# Patient Record
Sex: Male | Born: 1935 | Race: White | Hispanic: No | Smoking: Former smoker
Health system: Southern US, Community
[De-identification: ages and names within clinical notes are randomized; demographics above are authoritative.]

## PROBLEM LIST (undated history)

## (undated) DIAGNOSIS — M1990 Unspecified osteoarthritis, unspecified site: Secondary | ICD-10-CM

## (undated) DIAGNOSIS — G4730 Sleep apnea, unspecified: Secondary | ICD-10-CM

## (undated) DIAGNOSIS — E119 Type 2 diabetes mellitus without complications: Secondary | ICD-10-CM

## (undated) DIAGNOSIS — I10 Essential (primary) hypertension: Secondary | ICD-10-CM

---

## 1979-08-27 HISTORY — PX: HERNIA REPAIR: SHX51

## 1979-08-27 HISTORY — PX: ANAL FISSURE REPAIR: SHX2312

## 1995-12-27 HISTORY — PX: ANTERIOR FUSION CERVICAL SPINE: SUR626

## 2001-12-26 HISTORY — PX: JOINT REPLACEMENT: SHX530

## 2004-12-16 ENCOUNTER — Ambulatory Visit: Payer: Self-pay | Admitting: Family Medicine

## 2005-03-23 ENCOUNTER — Ambulatory Visit: Payer: Self-pay | Admitting: Family Medicine

## 2005-08-31 ENCOUNTER — Ambulatory Visit: Payer: Self-pay | Admitting: Family Medicine

## 2005-09-13 ENCOUNTER — Ambulatory Visit: Payer: Self-pay | Admitting: Family Medicine

## 2005-10-18 ENCOUNTER — Ambulatory Visit: Payer: Self-pay | Admitting: Family Medicine

## 2005-12-30 ENCOUNTER — Ambulatory Visit: Payer: Self-pay | Admitting: Family Medicine

## 2006-02-17 ENCOUNTER — Ambulatory Visit: Payer: Self-pay | Admitting: Family Medicine

## 2007-12-27 HISTORY — PX: BACK SURGERY: SHX140

## 2008-09-03 ENCOUNTER — Inpatient Hospital Stay (HOSPITAL_COMMUNITY): Admission: RE | Admit: 2008-09-03 | Discharge: 2008-09-11 | Payer: Self-pay | Admitting: Neurosurgery

## 2009-11-17 IMAGING — RF DG LUMBAR SPINE 2-3V
1 series · 2 of 2 positions shown · non-contrast
Comparison: Intraoperative radiographs dated 09/03/2008

CLINICAL DATA: Lumbar spinal stenosis.  Spondylolisthesis.

LUMBAR SPINE - 2-3 VIEW

[Series 1: run · 2 of 2 slices shown]
[im 1/2]
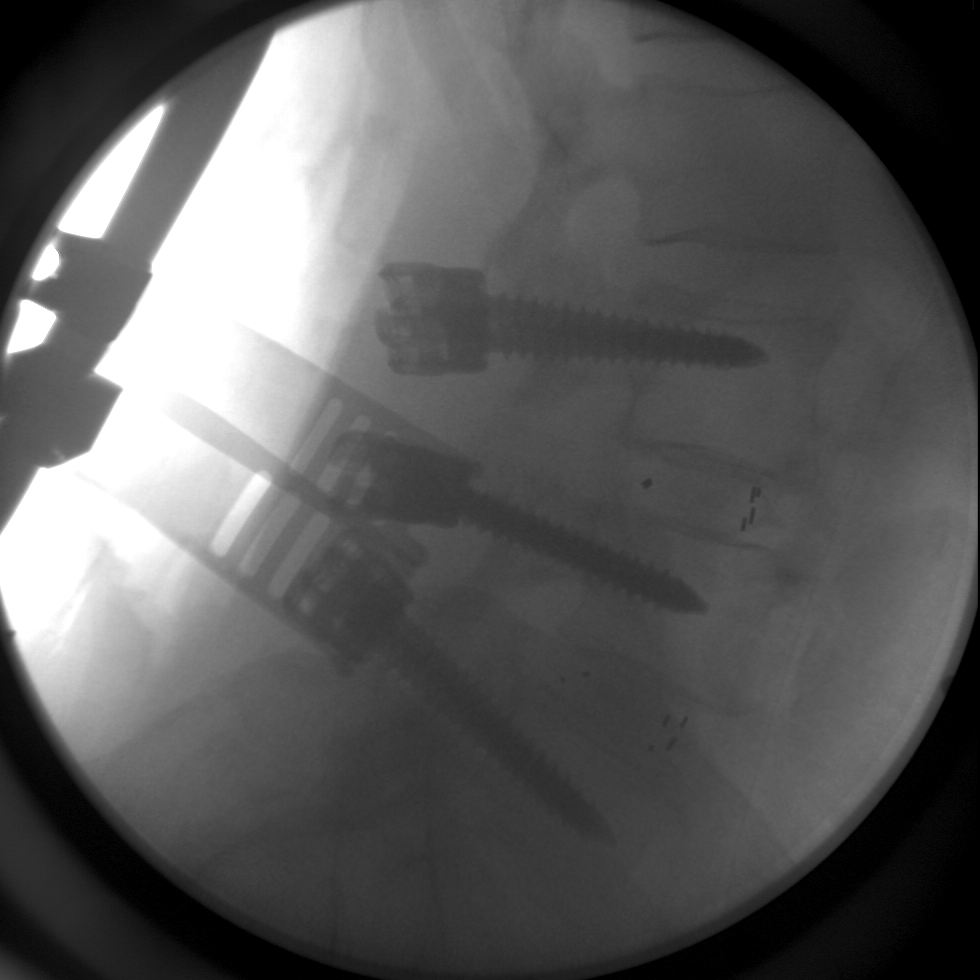
[im 2/2]
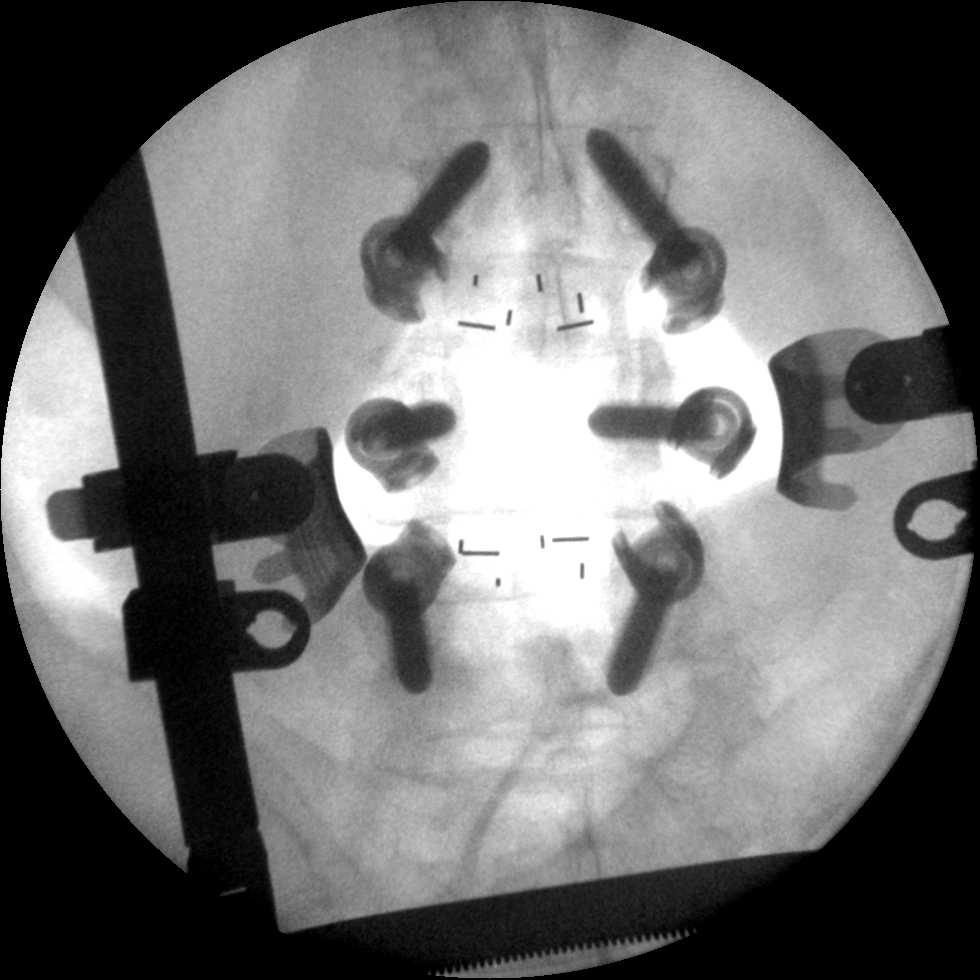

[2 of 2 positions shown; findings below may reference images not displayed]

FINDINGS: Interpedicular screws are in place at L3, L4, and L5.
Interbody spacers are present at L3-4 and L4-5.  Alignment is
anatomic.
IMPRESSION: Fusions being performed at L3-4 and L4-5.

## 2009-11-17 IMAGING — CR DG CHEST 2V
2 series · 2 of 2 positions shown · non-contrast
Comparison: None

CLINICAL DATA: Spondylolisthesis.

CHEST - 2 VIEW

[view not recorded (1 of 2)]
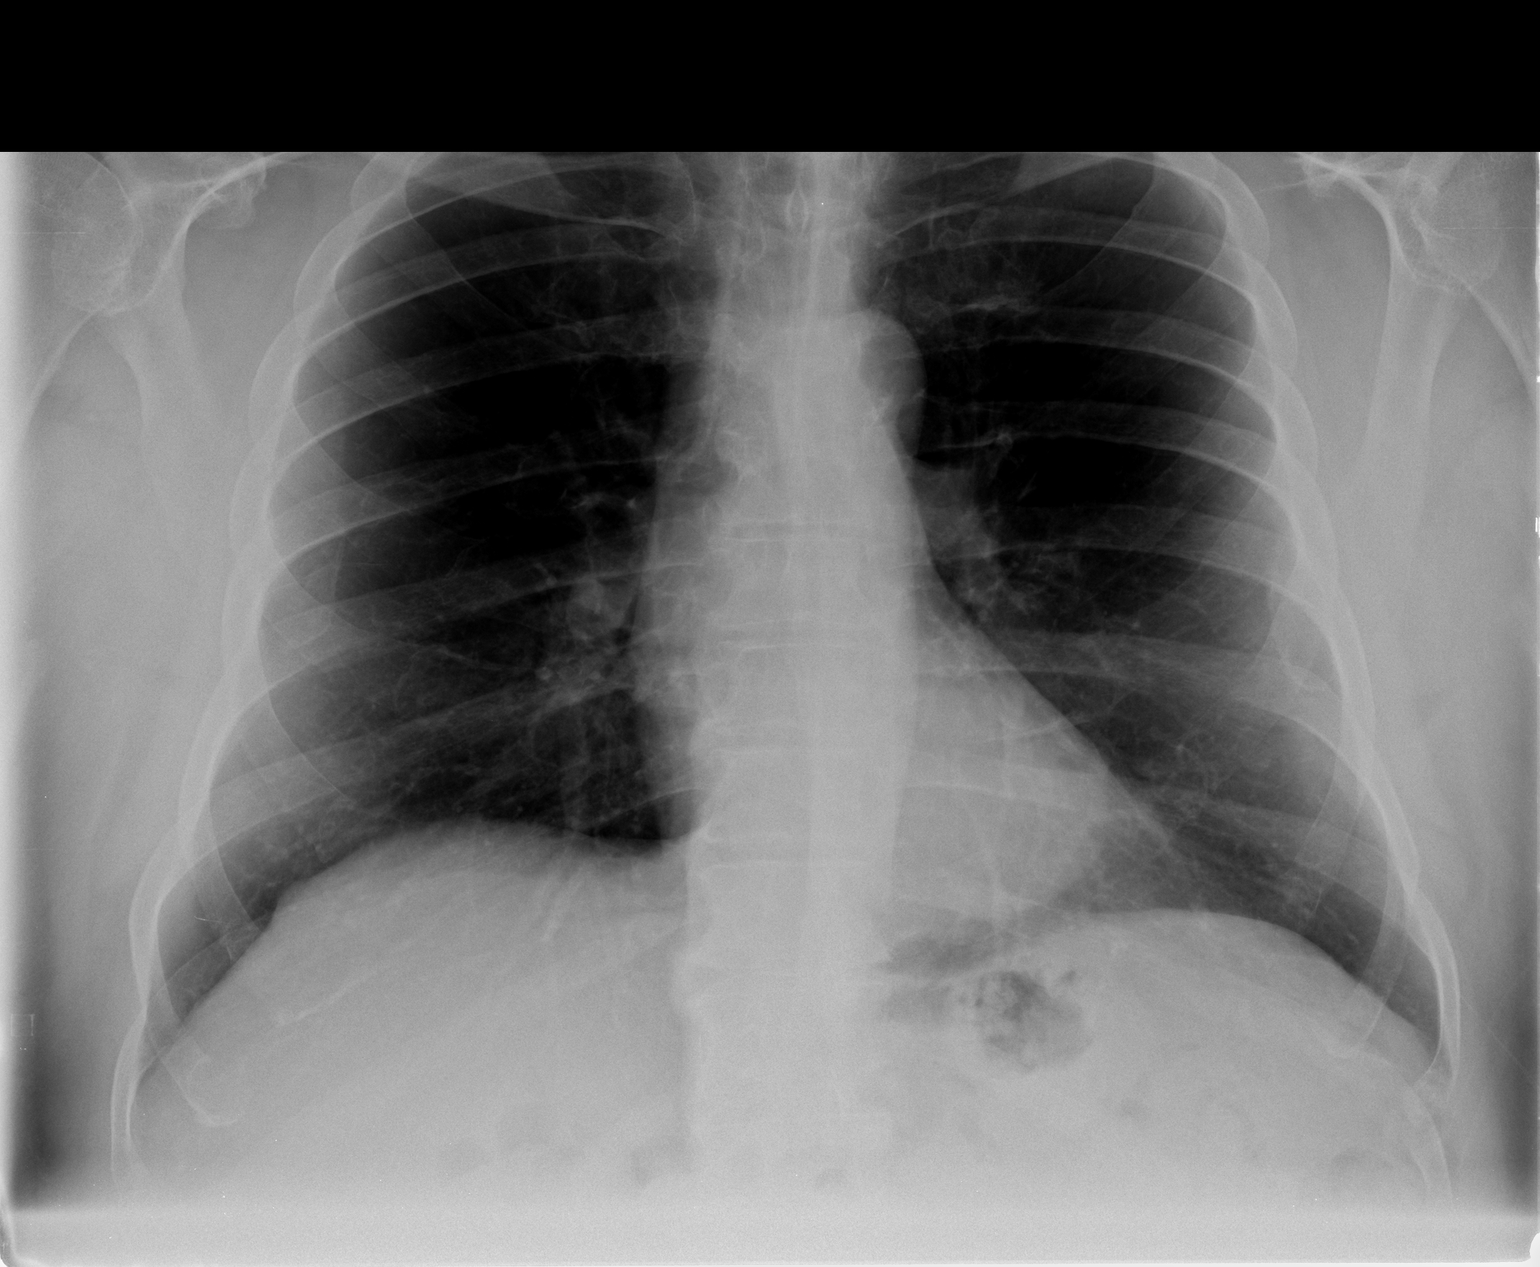

[view not recorded (2 of 2)]
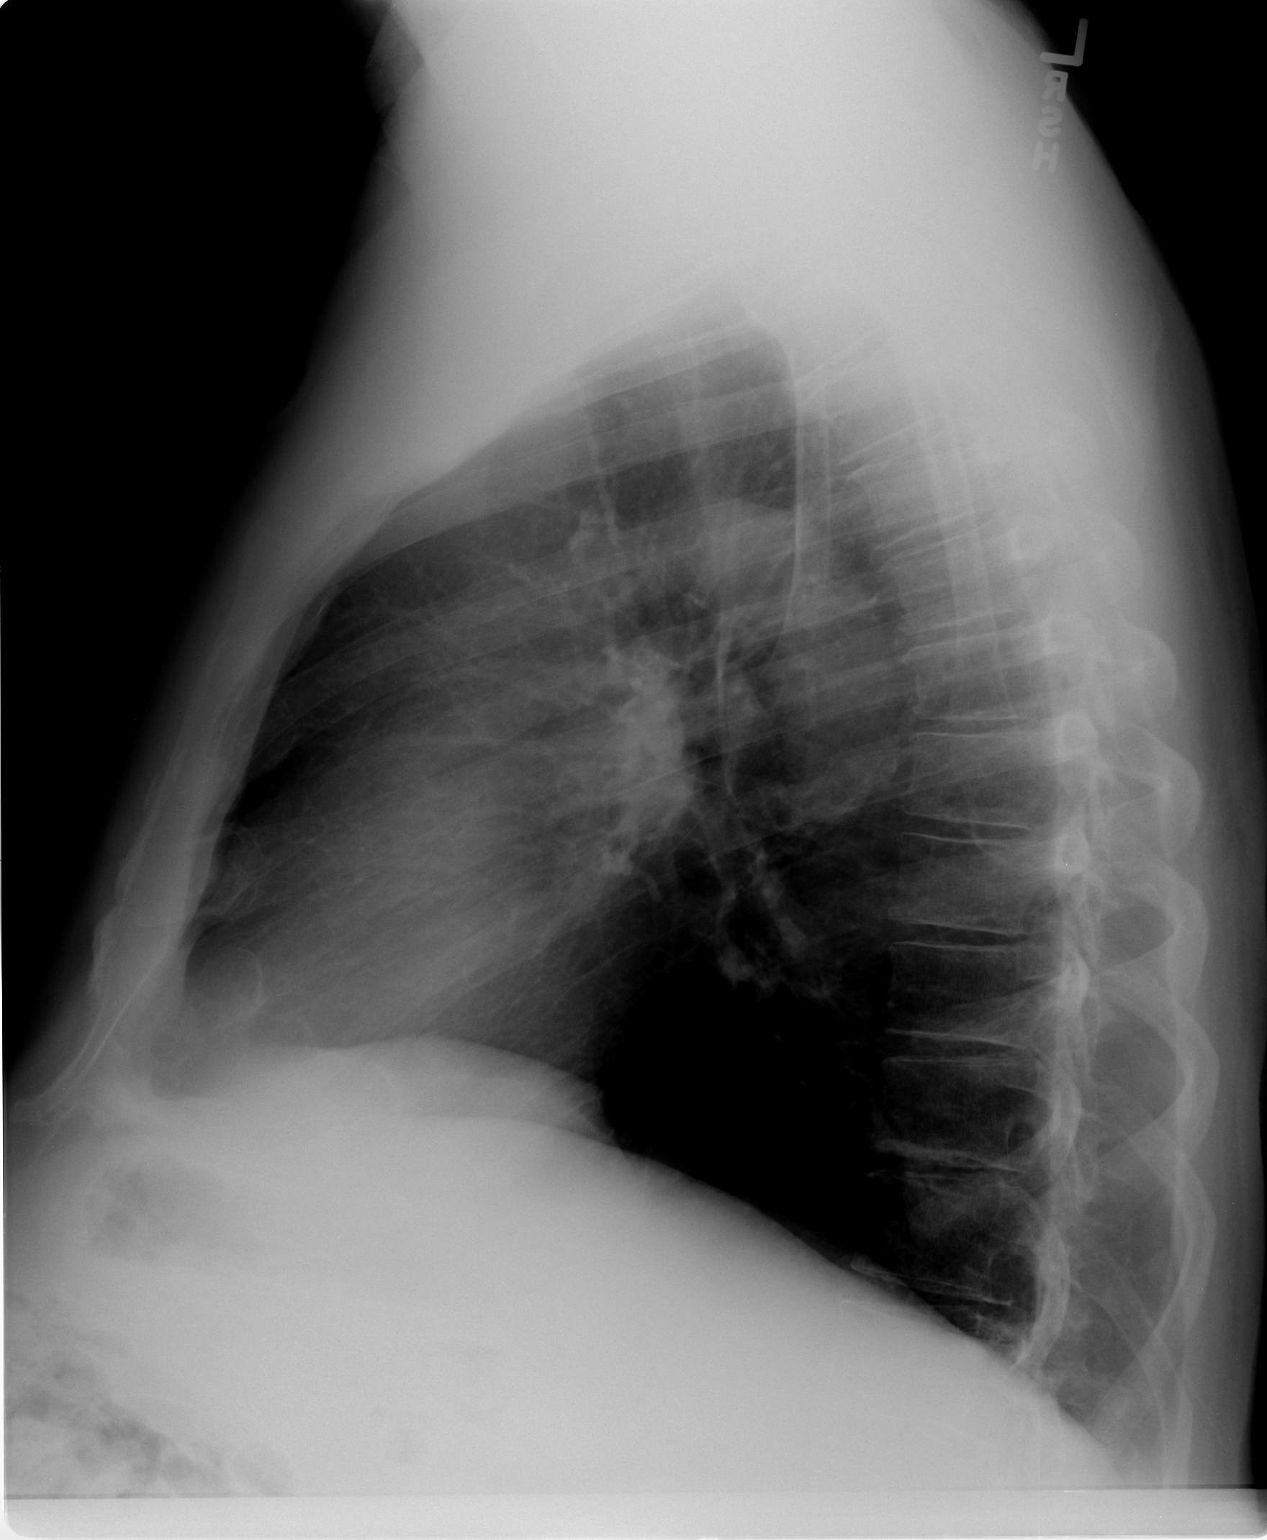

[2 of 2 positions shown; findings below may reference images not displayed]

FINDINGS: Heart size and mediastinal contours are unremarkable.

No pleural effusion or pulmonary interstitial edema is noted.

There is no airspace densities identified.
IMPRESSION: 1.  No acute cardiopulmonary abnormalities.

## 2011-05-10 NOTE — Discharge Summary (Signed)
Joshua Rich, JUTRAS NO.:  [id number]   MEDICAL RECORD NO.:  [id number]          PATIENT TYPE:  INP   LOCATION:  3016                         FACILITY:  [facility]   PHYSICIAN:  Cristi Loron, M.D.DATE OF BIRTH:  18-Nov-1935   DATE OF ADMISSION:  09/03/2008  DATE OF DISCHARGE:  09/11/2008                               DISCHARGE SUMMARY   BRIEF HISTORY:  The patient is a 76 year old white male who suffered  from chronic back and leg pain that has become severe over the last  several months.  She failed medical management and was worked up with a  lumbar MRI which demonstrated he had lumbar scoliosis,  spondylolisthesis, stenosis, etc., at L3-4 and L4-5.  I discussed the  various treatment with the patient including surgery.  The patient has  weighed the risks, benefits and alternatives of surgery and has decided  to proceed with an L3-4 and 4-5 decompression, instrumentation and  fusion.   For further details of this admission, please refer to typed history and  physical.   HOSPITAL COURSE:  I admitted the patient to [facility] on  September 03, 2008.  On the day of admission, I performed L3-4 and 4-5  decompression, instrumentation and fusion.  The surgery went well (for  full details of this operation, please refer to typed operative note).   POSTOPERATIVE COURSE:  The patient's postoperative course was  unremarkable.  We had PT and OT see the patient.  The patient did not  feel he was able to care for himself at home (he lives alone), and  therefore, arrangements were made for him to go to a nursing home for  recuperation from the surgery.  The arrangements were made.  The patient  was transferred to [facility] on September 11, 2008.   FINAL DIAGNOSES:  L3-4 and L4-5 spondylolisthesis, scoliosis, spinal  stenosis, degenerative disk disease, lumbar radiculopathy, lumbar  myelopathy, lumbago.   PROCEDURE PERFORMED:  L4 decompressive laminectomy  with bilateral L3  decompressive laminotomies to decompress the bilateral L3, L4 and L5  nerve roots; L3-4 and 4-5 posterior lumbar interbody fusion with local  morselized autograft bone and Vitoss/Actifuse bone graft extender;  insertion of L3-4 and L4-5 interbody prosthesis (PEEK interbody  prosthesis); L3-L5 posterior segmental instrumentation with Legacy  titanium pedicle screws and rods; L3-4 and 4-5 posterolateral  arthrodesis with local morselized autograft bone, Vitoss and Actifuse  bone graft extenders.   DISCHARGE INSTRUCTIONS:  The patient was given written discharge  instructions, and also questions were answered, and he is to follow up  with me in about 3 weeks.   DISCHARGE MEDICATIONS:  1. Colace 100 mg p.o. b.i.d.  2. Coreg 12.5 mg p.o. b.i.d.  3. Lasix 60 mg p.o. daily.  4. Lisinopril 40 mg p.o. daily.  5. Amaryl 2 mg p.o. daily.  6. Zocor 20 mg p.o. daily.  7. Regular insulin sliding scale.  8. Milk of magnesia 30 mg daily.  9. Dulcolax suppository p.r.n.  10.Percocet p.r.n.  11.Tylenol p.r.n.  12.Valium p.r.n.  13.Zofran p.r.n.  14.Ambien p.r.n.  15.Senokot p.r.n.  16.Cepacol p.r.n.   FINAL DIAGNOSES:  1. Hypertension.  2. Hypercholesterolemia.  3. Diabetes mellitus.  4. Cervical disk disease.  5. Knee osteoarthritis.      Cristi Loron, M.D.  Electronically Signed     JDJ/MEDQ  D:  09/11/2008  T:  09/11/2008  Job:  [id number]

## 2011-05-10 NOTE — Op Note (Signed)
NAMEGRAYDON, Joshua Rich NO.:  [id number]   MEDICAL RECORD NO.:  [id number]          PATIENT TYPE:  INP   LOCATION:  3016                         FACILITY:  [facility]   PHYSICIAN:  Cristi Loron, M.D.DATE OF BIRTH:  10-10-1935   DATE OF PROCEDURE:  09/03/2008  DATE OF DISCHARGE:                               OPERATIVE REPORT   BRIEF HISTORY:  The patient is a 76 year old white male who suffered  from chronic back and leg pain that has become severe over the last  several months.  He failed medical management and was worked up with a  lumbar MRI, which demonstrated he had lumbar scoliosis,  spondylolisthesis, stenosis, degenerative disk disease, etc., at L3-4  and L4-5.  I discussed various treatment options with the patient  including surgery.  The patient has weighed the risks, benefits, and  alternative for surgery and has decided to proceed with a L3-4 and L4-5  decompression, fusion, and instrumentation.   PREOPERATIVE STENOSES:  L3-4 and L4-5 spondylolisthesis, scoliosis,  spinal stenosis, degenerative disk disease, lumbar radiculopathy status  myelopathy, and lumbago.   POSTOPERATIVE DIAGNOSES:  L3-4 and L4-5 spondylolisthesis, scoliosis,  spinal stenosis, degenerative disk disease, lumbar radiculopathy status  myelopathy, and lumbago.   PROCEDURE:  1. L4 decompressive laminectomy with bilateral L3 laminotomies.  2. Decompressive bilateral L3, L4, and L5 nerve roots; L3-4 and L4-5      posterior lumbar interbody fusion with local morselized autograft      bone, Vitoss, and Actifuse bone graft extenders; insertion of L3-4      and L4-5 interbody prosthesis (Capstone PEEK interbody prosthesis);      L3-L5 posterior segmental instrumentation with Legacy titanium      pedicle screws and rods; L3-4 and L4-5 posterolateral arthrodesis      with local morselized autograft bone, Vitoss, and Actifuse bone      graft extenders.   SURGEON:  Cristi Loron,  MD.   ASSISTANT:  Stefani Dama, MD.   ANESTHESIA:  General endotracheal.   ESTIMATED BLOOD LOSS:  350 mL.   SPECIMENS:  None.   DRAINS:  None.   COMPLICATIONS:  Durotomy.   DESCRIPTION OF PROCEDURE:  The patient was brought to the operating room  by the Anesthesia Team. General endotracheal anesthesia was induced.  The patient was then turned to the prone position on Wilson frame.  His  lumbosacral region was then prepared with Betadine scrub and Betadine  solution.  Sterile drapes were applied.  I then injected the area to be  incised with Marcaine with epinephrine solution.  I used a scalpel to  make a linear midline incision over the L3-4 and L4-5 interspaces.  I  used electrocautery to perform a bilateral subperiosteal dissection  exposing spinous process and lamina of L2, L3, L4, L5, upper sacrum.  We  obtained intraoperative radiograph to confirm our location.  We inserted  the Versa-Trac retractor for exposure.   We began the decompression by performing bilateral L3-L4 laminotomies  with a high-speed drill.  I incised the interspinous ligament at L4-5  and L3-4.  I then used a Leksell rongeur to remove the spinous process  and part of the L4 lamina.  We saved this bone and later cleared off  soft tissue, morselized, and used as local autograft bone.  I completed  the L4 laminectomy and widened the L3 laminotomies with Kerrison punch  and removed the ligamentum flavum at L3-4 and L4-5.  Of note, the  patient has severe facet arthropathy at both the L3-4 and L4-5 and  resultant severe spinal stenosis, and therefore the work required to do  decompression at this level was much greater than would have simply been  required to do it with.  We removed the excess ligamentum flavum from  lateral recess and removed the medial aspect of the L3-4 and L4-5 facet  joints.  We then performed foraminotomies about the bilateral L3, L4,  and L5 nerve roots completing the  decompression.   We now turned our attention to posterior lumbar interbody fusion.  We  incised the L3-4 and L4-5 intervertebral disks with a 15 blade scalpel  using pituitary forceps and to perform a partial intervertebral  diskectomy bilaterally at L3-4 and L4-5.  We prepared the vertebral  endplates by using curettes for removal of the soft tissue.  We then  used trial spacers and determined to use 12 x 26 mm Capstone PEEK  interbody prosthesis.  We prefilled this prosthesis with combination of  local autograft bone, Vitoss, and Actifuse, and then as we did the disk  space as well, we then inserted a prosthesis into the disk space, of  course after retracting the neural structures out of harm's way, I then  had a snug fit of the prosthesis bilaterally at L3-4 and L4-5, and as  well, we did fill in the remainder disk space with local autograft bone,  Actifuse, and Vitoss completing the posterior lumbar interbody fusion.   We now turned our attention to the instrumentation.  Under fluoroscopic  guidance, we cannulated bilateral L3, L4, and L5 pedicles with a bone  probe.  We tapped the pedicles with a 5.5 mm tap.  We probed inside the  tap pedicles to right cortical breaches with a ball probe.  We inserted  7.5 x 50 mm pedicle screws bilaterally at L3, L4, and L5 under  fluoroscopic guidance.  We palpated along the medial aspect of the each  pedicle and noticed no cortical breaches and nerve roots were not  injured.  We then connected unilateral pedicle screws with a lordotic  rod.  We compressed to construct and secured the rod in place with the  caps, which we tightened appropriately.  We then placed a cross  connector and tightened appropriately completing the instrumentation.   We now turned our attention to posterolateral arthrodesis.  We used a  high-speed drill to decorticate the remainder of L3-4 and L4-5 facets,  pars, and transverse processes bilaterally.  We then laid a  combination  of local autograft bone, Vitoss, and Actifuse bone graft extenders over  these decorticated posterolateral structures completing the  posterolateral arthrodesis.   We then inspected bilateral L3, L4, and L5 nerve roots, and the thecal  sac and noted it was well decompressed.   I should mention that during the decompression, a small durotomy was  created with the Kerrison punch.  I closed the durotomy with a single 6-  0 Prolene suture.  I got a good closure, and then placed Tisseel over  this durotomy site.   We then obtained  hemostasis using bipolar electrocautery.  We irrigated  the wound out bacitracin solution and removed the retractors and then  reapproximated the patient's thoracolumbar fascia with interrupted #1  Vicryl suture, subcutaneous tissue with interrupted 2-0 Vicryl suture,  and the skin with Steri-Strips and Benzoin.  The wound was then coated  with bacitracin ointment.  Sterile dressing was applied.  The drapes  were removed, and the patient was  subsequently returned to supine position and was extubated by the  Anesthesia Team and transported to post anesthesia care unit in stable  condition.  All sponge, instrument, and needle counts were correct at  the end of this case.      Cristi Loron, M.D.  Electronically Signed     JDJ/MEDQ  D:  09/03/2008  T:  09/04/2008  Job:  [id number]

## 2011-09-26 LAB — GLUCOSE, CAPILLARY
Glucose-Capillary: 111 — ABNORMAL HIGH
Glucose-Capillary: 123 — ABNORMAL HIGH
Glucose-Capillary: 131 — ABNORMAL HIGH
Glucose-Capillary: 133 — ABNORMAL HIGH
Glucose-Capillary: 140 — ABNORMAL HIGH
Glucose-Capillary: 144 — ABNORMAL HIGH

## 2011-09-28 LAB — BASIC METABOLIC PANEL
Calcium: 9.6
Chloride: 109
Creatinine, Ser: 1.94 — ABNORMAL HIGH
GFR calc Af Amer: 41 — ABNORMAL LOW
GFR calc non Af Amer: 34 — ABNORMAL LOW
GFR calc non Af Amer: 56 — ABNORMAL LOW
Glucose, Bld: 108 — ABNORMAL HIGH
Potassium: 3.8
Sodium: 140
Sodium: 142

## 2011-09-28 LAB — GLUCOSE, CAPILLARY
Glucose-Capillary: 117 — ABNORMAL HIGH
Glucose-Capillary: 121 — ABNORMAL HIGH
Glucose-Capillary: 122 — ABNORMAL HIGH
Glucose-Capillary: 124 — ABNORMAL HIGH
Glucose-Capillary: 130 — ABNORMAL HIGH
Glucose-Capillary: 131 — ABNORMAL HIGH
Glucose-Capillary: 146 — ABNORMAL HIGH
Glucose-Capillary: 150 — ABNORMAL HIGH
Glucose-Capillary: 159 — ABNORMAL HIGH
Glucose-Capillary: 160 — ABNORMAL HIGH
Glucose-Capillary: 170 — ABNORMAL HIGH
Glucose-Capillary: 173 — ABNORMAL HIGH
Glucose-Capillary: 199 — ABNORMAL HIGH
Glucose-Capillary: 205 — ABNORMAL HIGH
Glucose-Capillary: 63 — ABNORMAL LOW

## 2011-09-28 LAB — CBC
HCT: 34 — ABNORMAL LOW
Hemoglobin: 11.4 — ABNORMAL LOW
Hemoglobin: 14.3
MCHC: 33.5
MCV: 91.3
RBC: 4.65
RDW: 13.9
WBC: 5.9

## 2011-09-28 LAB — ABO/RH: ABO/RH(D): A POS

## 2011-09-28 LAB — TYPE AND SCREEN
ABO/RH(D): A POS
Antibody Screen: NEGATIVE

## 2012-06-20 DIAGNOSIS — G959 Disease of spinal cord, unspecified: Secondary | ICD-10-CM | POA: Insufficient documentation

## 2012-06-20 DIAGNOSIS — M48061 Spinal stenosis, lumbar region without neurogenic claudication: Secondary | ICD-10-CM | POA: Insufficient documentation

## 2012-07-31 DIAGNOSIS — E669 Obesity, unspecified: Secondary | ICD-10-CM | POA: Insufficient documentation

## 2012-07-31 DIAGNOSIS — G4733 Obstructive sleep apnea (adult) (pediatric): Secondary | ICD-10-CM | POA: Insufficient documentation

## 2012-07-31 DIAGNOSIS — R202 Paresthesia of skin: Secondary | ICD-10-CM | POA: Insufficient documentation

## 2012-07-31 DIAGNOSIS — E119 Type 2 diabetes mellitus without complications: Secondary | ICD-10-CM | POA: Insufficient documentation

## 2012-07-31 DIAGNOSIS — G4730 Sleep apnea, unspecified: Secondary | ICD-10-CM | POA: Insufficient documentation

## 2012-07-31 DIAGNOSIS — I10 Essential (primary) hypertension: Secondary | ICD-10-CM | POA: Insufficient documentation

## 2012-07-31 DIAGNOSIS — M549 Dorsalgia, unspecified: Secondary | ICD-10-CM | POA: Insufficient documentation

## 2012-07-31 DIAGNOSIS — M542 Cervicalgia: Secondary | ICD-10-CM | POA: Insufficient documentation

## 2012-08-09 DIAGNOSIS — M25569 Pain in unspecified knee: Secondary | ICD-10-CM | POA: Insufficient documentation

## 2012-08-09 DIAGNOSIS — R32 Unspecified urinary incontinence: Secondary | ICD-10-CM | POA: Insufficient documentation

## 2012-08-09 DIAGNOSIS — Z973 Presence of spectacles and contact lenses: Secondary | ICD-10-CM | POA: Insufficient documentation

## 2012-08-09 DIAGNOSIS — E785 Hyperlipidemia, unspecified: Secondary | ICD-10-CM | POA: Insufficient documentation

## 2012-08-09 DIAGNOSIS — F32A Depression, unspecified: Secondary | ICD-10-CM | POA: Insufficient documentation

## 2012-08-10 HISTORY — PX: POSTERIOR CERVICAL FUSION/FORAMINOTOMY: SHX5038

## 2012-08-15 HISTORY — PX: THORACIC SPINE SURGERY: SHX802

## 2012-10-01 DIAGNOSIS — G709 Myoneural disorder, unspecified: Secondary | ICD-10-CM | POA: Insufficient documentation

## 2014-02-23 ENCOUNTER — Other Ambulatory Visit: Payer: Self-pay | Admitting: Surgical

## 2014-02-23 NOTE — H&P (Signed)
TOTAL KNEE ADMISSION H&P  Patient is being admitted for right total knee arthroplasty.  Subjective:  Chief Complaint:right knee pain.  HPI: Joshua Rich, 79 y.o. male, has a history of pain and functional disability in the right knee due to arthritis and has failed non-surgical conservative treatments for greater than 12 weeks to includeNSAID's and/or analgesics, corticosteriod injections, viscosupplementation injections and activity modification.  Onset of symptoms was gradual, starting >10 years ago with gradually worsening course since that time. The patient noted no past surgery on the right knee(s).  Patient currently rates pain in the right knee(s) at 8 out of 10 with activity. Patient has night pain, worsening of pain with activity and weight bearing, pain that interferes with activities of daily living, pain with passive range of motion, crepitus and joint swelling.  Patient has evidence of periarticular osteophytes and joint space narrowing by imaging studies. There is no active infection.   Allergies No Known Drug Allergies.  Family History Cancer. Father. Congestive Heart Failure. Brother. Depression. Sister. Diabetes Mellitus. Brother, Father, Sister. Drug / Alcohol Addiction. Father. Hypertension. Brother, Father, Sister.  Social History Children. 2 Current drinker.Currently drinks beer less than 5 times per week Current work status. retired Exercise. Exercises daily Living situation. live alone Marital status. widowed Tobacco use. Former smoker. smoke(d) 1/2 pack(s) per day Under pain contract  Medication History Cartia XT (180MG  Capsule ER 24HR, Oral) Active. Irbesartan (300MG  Tablet, Oral) Active. Furosemide (40MG  Tablet, Oral) Active. Sertraline HCl (25MG  Tablet, Oral) Active. Glimepiride (1MG  Tablet, Oral) Active. Carvedilol (25MG  Tablet, Oral) Active. Pravastatin Sodium (20MG  Tablet, Oral) Active.  Past Surgical History Anal Fissure  Repair Colon Polyp Removal - Colonoscopy Inguinal Hernia Repair. laparoscopic: bilateral and multiple times Neck Disc Surgery Spinal Fusion. neck and lower back Spinal Surgery Total Knee Replacement. left  Past Medical History Chronic Pain Depression Diabetes Mellitus, Type II Hypertension Hypercholesterolemia Osteoarthritis Osteoporosis Peripheral Neuropathy Sleep Apnea  Review of Systems  Constitutional: Negative for fever, chills, weight loss, malaise/fatigue and diaphoresis.  HENT: Positive for hearing loss and tinnitus. Negative for congestion, ear discharge, ear pain, nosebleeds and sore throat.   Eyes: Negative.   Respiratory: Negative.  Negative for stridor.   Cardiovascular: Negative.   Gastrointestinal: Negative.   Genitourinary: Positive for frequency. Negative for dysuria, urgency, hematuria and flank pain.  Musculoskeletal: Positive for back pain and joint pain. Negative for falls, myalgias and neck pain.       Right knee pain  Skin: Positive for itching and rash.       Due to eczema  Neurological: Positive for weakness. Negative for dizziness, tingling, tremors, sensory change, speech change, focal weakness, seizures, loss of consciousness and headaches.  Endo/Heme/Allergies: Negative.   Psychiatric/Behavioral: Negative.     Objective:  Physical Exam  Constitutional: He is oriented to person, place, and time. He appears well-developed and well-nourished. No distress.  HENT:  Head: Normocephalic and atraumatic.  Right Ear: External ear normal.  Left Ear: External ear normal.  Nose: Nose normal.  Mouth/Throat: Oropharynx is clear and moist.  Eyes: Conjunctivae are normal.  Neck: Normal range of motion. Neck supple.  Cardiovascular: Normal rate, regular rhythm, normal heart sounds and intact distal pulses.   No murmur heard. Respiratory: Effort normal and breath sounds normal. No respiratory distress. He has no wheezes.  GI: Soft. He exhibits no  distension. There is no tenderness.  Musculoskeletal:       Right hip: Normal.       Left hip: Normal.  Right knee: He exhibits decreased range of motion, swelling and effusion. He exhibits no erythema. Tenderness found. Medial joint line and lateral joint line tenderness noted.       Left knee: Normal.       Right lower leg: He exhibits edema. He exhibits no tenderness.       Left lower leg: Normal.  His hip shows normal range of motion and no discomfort. His left knee look great. There is no swelling. Range is 0 to 120 degrees with no tenderness or instability. The right knee varus deformity. Small effusion. Range about 5 to 120. Marked crepitus on range of motion. Tender medial greater than lateral with no instability noted  Neurological: He is alert and oriented to person, place, and time. He has normal strength and normal reflexes. No sensory deficit.  Skin: No rash noted. He is not diaphoretic. No erythema.  Psychiatric: He has a normal mood and affect. His behavior is normal.   Vitals Weight: 211 lb Height: 68 in Body Surface Area: 2.14 m Body Mass Index: 32.08 kg/m Pulse: 72 (Regular) BP: 142/88 (Sitting, Left Arm, Standard)  Imaging Review Plain radiographs demonstrate severe degenerative joint disease of the right knee(s). The overall alignment ismild varus. The bone quality appears to be adequate for age and reported activity level.  Assessment/Plan:  End stage arthritis, right knee   The patient history, physical examination, clinical judgment of the provider and imaging studies are consistent with end stage degenerative joint disease of the right knee(s) and total knee arthroplasty is deemed medically necessary. The treatment options including medical management, injection therapy arthroscopy and arthroplasty were discussed at length. The risks and benefits of total knee arthroplasty were presented and reviewed. The risks due to aseptic loosening,  infection, stiffness, patella tracking problems, thromboembolic complications and other imponderables were discussed. The patient acknowledged the explanation, agreed to proceed with the plan and consent was signed. Patient is being admitted for inpatient treatment for surgery, pain control, PT, OT, prophylactic antibiotics, VTE prophylaxis, progressive ambulation and ADL's and discharge planning. The patient is planning to be discharged to skilled nursing facility (prefers [facility] in [place])  PCP: Dr. Dina Rich Other: Dr. Orene Desanctis   Patient to receive TXA   Dimitri Ped, PA-C

## 2014-03-03 NOTE — Progress Notes (Signed)
Surgery on 03/17/14,.  proep on 03/11/14 at 1000am.  Need orders in EPIC.  Thank You.

## 2014-03-04 ENCOUNTER — Other Ambulatory Visit: Payer: Self-pay | Admitting: Orthopedic Surgery

## 2014-03-05 ENCOUNTER — Encounter (HOSPITAL_COMMUNITY): Payer: Self-pay | Admitting: Pharmacy Technician

## 2014-03-07 ENCOUNTER — Other Ambulatory Visit (HOSPITAL_COMMUNITY): Payer: Self-pay | Admitting: Orthopedic Surgery

## 2014-03-07 NOTE — Patient Instructions (Addendum)
20 Joshua Rich  03/07/2014   Your procedure is scheduled on: Monday February 23rd  Report to [facility] at 730 AM.  Call this number if you have problems the morning of surgery [phone]   Remember: bring your cpap mask and tubing, short stay will draw your blood type morning of surgery.  Do not eat food or drink liquids :After Midnight.     Take these medicines the morning of surgery with A SIP OF WATER: carvedilol, cardizem (cartia), irbesartan, sertraline                                SEE [facility] PREPARING FOR SURGERY SHEET             You may not have any metal on your body including hair pins and piercings  Do not wear jewelry, make-up.  Do not wear lotions, powders, or perfumes. No  Deodorant is to be worn.   Men may shave face and neck.  Do not bring valuables to the hospital. [facility] IS NOT RESPONSIBLE FOR VALUEABLES.  Contacts, dentures or bridgework may not be worn into surgery.  Leave suitcase in the car. After surgery it may be brought to your room.  For patients admitted to the hospital, checkout time is 11:00 AM the day of discharge.    Please read over the following fact sheets that you were given: [facility] Preparing for surgery sheet,  incentive spirometer sheet, blood fact sheet, MRSA information  Call Cain SieveSharon Swan Zayed RN pre op nurse if needed [phone]    FAILURE TO FOLLOW THESE INSTRUCTIONS MAY RESULT IN THE CANCELLATION OF YOUR SURGERY.  PATIENT SIGNATURE___________________________________________  NURSE SIGNATURE_____________________________________________

## 2014-03-11 ENCOUNTER — Encounter (HOSPITAL_COMMUNITY)
Admission: RE | Admit: 2014-03-11 | Discharge: 2014-03-11 | Disposition: A | Discharge status: Home / Self Care | Payer: Medicare Other | Source: Ambulatory Visit | Attending: Orthopedic Surgery | Admitting: Orthopedic Surgery

## 2014-03-11 ENCOUNTER — Ambulatory Visit (HOSPITAL_COMMUNITY)
Admission: RE | Admit: 2014-03-11 | Discharge: 2014-03-11 | Disposition: A | Discharge status: Home / Self Care | Payer: Medicare Other | Source: Ambulatory Visit | Attending: Orthopedic Surgery | Admitting: Orthopedic Surgery

## 2014-03-11 ENCOUNTER — Encounter (HOSPITAL_COMMUNITY): Payer: Self-pay

## 2014-03-11 ENCOUNTER — Encounter (INDEPENDENT_AMBULATORY_CARE_PROVIDER_SITE_OTHER): Payer: Self-pay

## 2014-03-11 DIAGNOSIS — Z01812 Encounter for preprocedural laboratory examination: Secondary | ICD-10-CM | POA: Insufficient documentation

## 2014-03-11 DIAGNOSIS — I440 Atrioventricular block, first degree: Secondary | ICD-10-CM | POA: Insufficient documentation

## 2014-03-11 DIAGNOSIS — Z01818 Encounter for other preprocedural examination: Secondary | ICD-10-CM | POA: Insufficient documentation

## 2014-03-11 DIAGNOSIS — M169 Osteoarthritis of hip, unspecified: Secondary | ICD-10-CM | POA: Insufficient documentation

## 2014-03-11 DIAGNOSIS — Z01810 Encounter for preprocedural cardiovascular examination: Secondary | ICD-10-CM | POA: Insufficient documentation

## 2014-03-11 DIAGNOSIS — M1610 Unilateral primary osteoarthritis, unspecified hip: Secondary | ICD-10-CM | POA: Insufficient documentation

## 2014-03-11 HISTORY — DX: Type 2 diabetes mellitus without complications: E11.9

## 2014-03-11 HISTORY — DX: Unspecified osteoarthritis, unspecified site: M19.90

## 2014-03-11 HISTORY — DX: Essential (primary) hypertension: I10

## 2014-03-11 HISTORY — DX: Sleep apnea, unspecified: G47.30

## 2014-03-11 LAB — COMPREHENSIVE METABOLIC PANEL
ALK PHOS: 94 U/L (ref 39–117)
ALT: 15 U/L (ref 0–53)
AST: 18 U/L (ref 0–37)
Albumin: 4.1 g/dL (ref 3.5–5.2)
BUN: 12 mg/dL (ref 6–23)
CALCIUM: 9.4 mg/dL (ref 8.4–10.5)
CO2: 30 meq/L (ref 19–32)
Chloride: 100 mEq/L (ref 96–112)
Creatinine, Ser: 1.06 mg/dL (ref 0.50–1.35)
GFR calc non Af Amer: 66 mL/min — ABNORMAL LOW (ref >90)
GFR, EST AFRICAN AMERICAN: 77 mL/min — AB (ref >90)
GLUCOSE: 142 mg/dL — AB (ref 70–99)
POTASSIUM: 3.4 meq/L — AB (ref 3.7–5.3)
Sodium: 141 mEq/L (ref 137–147)
Total Bilirubin: 0.5 mg/dL (ref 0.3–1.2)
Total Protein: 7.3 g/dL (ref 6.0–8.3)

## 2014-03-11 LAB — SURGICAL PCR SCREEN
MRSA, PCR: NEGATIVE
Staphylococcus aureus: POSITIVE — AB

## 2014-03-11 LAB — APTT: aPTT: 27 seconds (ref 24–37)

## 2014-03-11 LAB — URINALYSIS, ROUTINE W REFLEX MICROSCOPIC
Bilirubin Urine: NEGATIVE
GLUCOSE, UA: NEGATIVE mg/dL
HGB URINE DIPSTICK: NEGATIVE
KETONES UR: NEGATIVE mg/dL
Leukocytes, UA: NEGATIVE
Nitrite: NEGATIVE
PROTEIN: NEGATIVE mg/dL
Specific Gravity, Urine: 1.011 (ref 1.005–1.030)
Urobilinogen, UA: 0.2 mg/dL (ref 0.0–1.0)
pH: 5 (ref 5.0–8.0)

## 2014-03-11 LAB — PROTIME-INR
INR: 1.02 (ref 0.00–1.49)
Prothrombin Time: 13.2 seconds (ref 11.6–15.2)

## 2014-03-11 LAB — CBC
HEMATOCRIT: 41.5 % (ref 39.0–52.0)
HEMOGLOBIN: 14.4 g/dL (ref 13.0–17.0)
MCH: 30.3 pg (ref 26.0–34.0)
MCHC: 34.7 g/dL (ref 30.0–36.0)
MCV: 87.4 fL (ref 78.0–100.0)
Platelets: 208 10*3/uL (ref 150–400)
RBC: 4.75 MIL/uL (ref 4.22–5.81)
RDW: 13.1 % (ref 11.5–15.5)
WBC: 8.3 10*3/uL (ref 4.0–10.5)

## 2014-03-11 NOTE — Progress Notes (Signed)
Right leg pt complain of leg feels like packed in ice, no swelling redness on right lower extremity, pt to see dr dough this week to address right le concern prior to surgery.

## 2014-03-17 ENCOUNTER — Encounter (HOSPITAL_COMMUNITY): Payer: Self-pay

## 2014-03-17 ENCOUNTER — Encounter (HOSPITAL_COMMUNITY)
Admission: RE | Disposition: A | Discharge status: Skilled Nursing Facility | Payer: Self-pay | Source: Ambulatory Visit | Attending: Orthopedic Surgery

## 2014-03-17 ENCOUNTER — Inpatient Hospital Stay (HOSPITAL_COMMUNITY): Payer: Medicare Other | Admitting: Certified Registered Nurse Anesthetist

## 2014-03-17 ENCOUNTER — Inpatient Hospital Stay (HOSPITAL_COMMUNITY)
Admission: RE | Admit: 2014-03-17 | Discharge: 2014-03-20 | DRG: 470 | Disposition: A | Discharge status: Skilled Nursing Facility | Payer: Medicare Other | Source: Ambulatory Visit | Attending: Orthopedic Surgery | Admitting: Orthopedic Surgery

## 2014-03-17 ENCOUNTER — Encounter (HOSPITAL_COMMUNITY): Payer: Medicare Other | Admitting: Certified Registered Nurse Anesthetist

## 2014-03-17 DIAGNOSIS — H9190 Unspecified hearing loss, unspecified ear: Secondary | ICD-10-CM | POA: Diagnosis present

## 2014-03-17 DIAGNOSIS — M179 Osteoarthritis of knee, unspecified: Secondary | ICD-10-CM | POA: Diagnosis present

## 2014-03-17 DIAGNOSIS — M810 Age-related osteoporosis without current pathological fracture: Secondary | ICD-10-CM | POA: Diagnosis present

## 2014-03-17 DIAGNOSIS — Z79899 Other long term (current) drug therapy: Secondary | ICD-10-CM

## 2014-03-17 DIAGNOSIS — G8929 Other chronic pain: Secondary | ICD-10-CM | POA: Diagnosis present

## 2014-03-17 DIAGNOSIS — Z6830 Body mass index (BMI) 30.0-30.9, adult: Secondary | ICD-10-CM

## 2014-03-17 DIAGNOSIS — G4730 Sleep apnea, unspecified: Secondary | ICD-10-CM | POA: Diagnosis present

## 2014-03-17 DIAGNOSIS — E119 Type 2 diabetes mellitus without complications: Secondary | ICD-10-CM | POA: Diagnosis present

## 2014-03-17 DIAGNOSIS — G609 Hereditary and idiopathic neuropathy, unspecified: Secondary | ICD-10-CM | POA: Diagnosis present

## 2014-03-17 DIAGNOSIS — IMO0002 Reserved for concepts with insufficient information to code with codable children: Principal | ICD-10-CM | POA: Diagnosis present

## 2014-03-17 DIAGNOSIS — D62 Acute posthemorrhagic anemia: Secondary | ICD-10-CM | POA: Diagnosis not present

## 2014-03-17 DIAGNOSIS — Z96659 Presence of unspecified artificial knee joint: Secondary | ICD-10-CM

## 2014-03-17 DIAGNOSIS — E7800 Pure hypercholesterolemia, unspecified: Secondary | ICD-10-CM | POA: Diagnosis present

## 2014-03-17 DIAGNOSIS — Z981 Arthrodesis status: Secondary | ICD-10-CM

## 2014-03-17 DIAGNOSIS — M1710 Unilateral primary osteoarthritis, unspecified knee: Secondary | ICD-10-CM

## 2014-03-17 DIAGNOSIS — Z87891 Personal history of nicotine dependence: Secondary | ICD-10-CM

## 2014-03-17 DIAGNOSIS — I10 Essential (primary) hypertension: Secondary | ICD-10-CM | POA: Diagnosis present

## 2014-03-17 DIAGNOSIS — Z96651 Presence of right artificial knee joint: Secondary | ICD-10-CM

## 2014-03-17 DIAGNOSIS — H9319 Tinnitus, unspecified ear: Secondary | ICD-10-CM | POA: Diagnosis present

## 2014-03-17 HISTORY — PX: TOTAL KNEE ARTHROPLASTY: SHX125

## 2014-03-17 LAB — GLUCOSE, CAPILLARY
GLUCOSE-CAPILLARY: 124 mg/dL — AB (ref 70–99)
GLUCOSE-CAPILLARY: 268 mg/dL — AB (ref 70–99)
Glucose-Capillary: 253 mg/dL — ABNORMAL HIGH (ref 70–99)

## 2014-03-17 LAB — TYPE AND SCREEN
ABO/RH(D): A POS
ANTIBODY SCREEN: NEGATIVE

## 2014-03-17 SURGERY — ARTHROPLASTY, KNEE, TOTAL
Anesthesia: Spinal | Site: Knee | Laterality: Right

## 2014-03-17 MED ORDER — LACTATED RINGERS IV SOLN
INTRAVENOUS | Status: DC
  Administered 2014-03-17: 1000 mL via INTRAVENOUS
  Administered 2014-03-17: 12:00:00 via INTRAVENOUS

## 2014-03-17 MED ORDER — ONDANSETRON HCL 4 MG/2ML IJ SOLN
INTRAMUSCULAR | Status: AC
  Filled 2014-03-17: qty 2

## 2014-03-17 MED ORDER — ONDANSETRON HCL 4 MG PO TABS: 4.0000 mg | ORAL_TABLET | Freq: Four times a day (QID) | ORAL | Status: DC | PRN

## 2014-03-17 MED ORDER — MORPHINE SULFATE ER 20 MG PO CP24: 20.0000 mg | ORAL_CAPSULE | Freq: Every evening | ORAL | Status: DC

## 2014-03-17 MED ORDER — ACETAMINOPHEN 650 MG RE SUPP: 650.0000 mg | Freq: Four times a day (QID) | RECTAL | Status: DC | PRN

## 2014-03-17 MED ORDER — DEXAMETHASONE SODIUM PHOSPHATE 10 MG/ML IJ SOLN
10.0000 mg | Freq: Once | INTRAMUSCULAR | Status: AC
  Administered 2014-03-17: 10 mg via INTRAVENOUS

## 2014-03-17 MED ORDER — GLIMEPIRIDE 1 MG PO TABS
1.0000 mg | ORAL_TABLET | Freq: Every day | ORAL | Status: DC
  Administered 2014-03-18 – 2014-03-20 (×3): 1 mg via ORAL
  Filled 2014-03-17 (×4): qty 1

## 2014-03-17 MED ORDER — BUPIVACAINE IN DEXTROSE 0.75-8.25 % IT SOLN
INTRATHECAL | Status: DC | PRN
  Administered 2014-03-17: 2 mL via INTRATHECAL

## 2014-03-17 MED ORDER — SODIUM CHLORIDE 0.9 % IJ SOLN
INTRAMUSCULAR | Status: DC | PRN
  Administered 2014-03-17: 10 mL via INTRAVENOUS

## 2014-03-17 MED ORDER — RIVAROXABAN 10 MG PO TABS
10.0000 mg | ORAL_TABLET | Freq: Every day | ORAL | Status: DC
  Filled 2014-03-17: qty 1

## 2014-03-17 MED ORDER — SODIUM CHLORIDE 0.9 % IJ SOLN
INTRAMUSCULAR | Status: AC
  Filled 2014-03-17: qty 10

## 2014-03-17 MED ORDER — SODIUM CHLORIDE 0.9 % IR SOLN
Status: DC | PRN
  Administered 2014-03-17: 1000 mL

## 2014-03-17 MED ORDER — MIDAZOLAM HCL 5 MG/5ML IJ SOLN
INTRAMUSCULAR | Status: DC | PRN
  Administered 2014-03-17 (×2): 1 mg via INTRAVENOUS

## 2014-03-17 MED ORDER — OXYCODONE HCL 5 MG PO TABS: 5.0000 mg | ORAL_TABLET | Freq: Once | ORAL | Status: DC | PRN

## 2014-03-17 MED ORDER — MIDAZOLAM HCL 2 MG/2ML IJ SOLN
INTRAMUSCULAR | Status: AC
  Filled 2014-03-17: qty 2

## 2014-03-17 MED ORDER — 0.9 % SODIUM CHLORIDE (POUR BTL) OPTIME
TOPICAL | Status: DC | PRN
  Administered 2014-03-17: 1000 mL

## 2014-03-17 MED ORDER — METHOCARBAMOL 100 MG/ML IJ SOLN
500.0000 mg | Freq: Four times a day (QID) | INTRAVENOUS | Status: DC | PRN
  Filled 2014-03-17: qty 5

## 2014-03-17 MED ORDER — FUROSEMIDE 40 MG PO TABS
40.0000 mg | ORAL_TABLET | Freq: Every morning | ORAL | Status: DC
  Administered 2014-03-17 – 2014-03-20 (×4): 40 mg via ORAL
  Filled 2014-03-17 (×4): qty 1

## 2014-03-17 MED ORDER — CEFAZOLIN SODIUM-DEXTROSE 2-3 GM-% IV SOLR
INTRAVENOUS | Status: AC
  Filled 2014-03-17: qty 50

## 2014-03-17 MED ORDER — FENTANYL CITRATE 0.05 MG/ML IJ SOLN
INTRAMUSCULAR | Status: DC | PRN
Start: 2014-03-17 — End: 2014-03-17
  Administered 2014-03-17: 50 ug via INTRAVENOUS

## 2014-03-17 MED ORDER — DEXAMETHASONE SODIUM PHOSPHATE 10 MG/ML IJ SOLN
INTRAMUSCULAR | Status: AC
  Filled 2014-03-17: qty 1

## 2014-03-17 MED ORDER — ACETAMINOPHEN 10 MG/ML IV SOLN
1000.0000 mg | Freq: Once | INTRAVENOUS | Status: AC
  Administered 2014-03-17: 1000 mg via INTRAVENOUS
  Filled 2014-03-17: qty 100

## 2014-03-17 MED ORDER — METOCLOPRAMIDE HCL 5 MG/ML IJ SOLN: 5.0000 mg | Freq: Three times a day (TID) | INTRAMUSCULAR | Status: DC | PRN

## 2014-03-17 MED ORDER — MENTHOL 3 MG MT LOZG
1.0000 | LOZENGE | OROMUCOSAL | Status: DC | PRN
  Filled 2014-03-17: qty 9

## 2014-03-17 MED ORDER — SODIUM CHLORIDE 0.9 % IJ SOLN
INTRAMUSCULAR | Status: AC
  Filled 2014-03-17: qty 50

## 2014-03-17 MED ORDER — BISACODYL 10 MG RE SUPP: 10.0000 mg | Freq: Every day | RECTAL | Status: DC | PRN

## 2014-03-17 MED ORDER — DIPHENHYDRAMINE HCL 12.5 MG/5ML PO ELIX: 12.5000 mg | ORAL_SOLUTION | ORAL | Status: DC | PRN

## 2014-03-17 MED ORDER — FENTANYL CITRATE 0.05 MG/ML IJ SOLN
INTRAMUSCULAR | Status: AC
  Filled 2014-03-17: qty 2

## 2014-03-17 MED ORDER — ONDANSETRON HCL 4 MG/2ML IJ SOLN: 4.0000 mg | Freq: Four times a day (QID) | INTRAMUSCULAR | Status: DC | PRN

## 2014-03-17 MED ORDER — CARVEDILOL 25 MG PO TABS
25.0000 mg | ORAL_TABLET | Freq: Two times a day (BID) | ORAL | Status: DC
  Administered 2014-03-17 – 2014-03-20 (×6): 25 mg via ORAL
  Filled 2014-03-17 (×8): qty 1

## 2014-03-17 MED ORDER — MEPERIDINE HCL 50 MG/ML IJ SOLN: 6.2500 mg | INTRAMUSCULAR | Status: DC | PRN

## 2014-03-17 MED ORDER — HYDROMORPHONE HCL PF 1 MG/ML IJ SOLN: 0.2500 mg | INTRAMUSCULAR | Status: DC | PRN

## 2014-03-17 MED ORDER — SODIUM CHLORIDE 0.9 % IV SOLN
INTRAVENOUS | Status: DC
  Administered 2014-03-18 – 2014-03-19 (×3): via INTRAVENOUS

## 2014-03-17 MED ORDER — SODIUM CHLORIDE 0.9 % IV SOLN: INTRAVENOUS | Status: DC

## 2014-03-17 MED ORDER — STERILE WATER FOR IRRIGATION IR SOLN
Status: DC | PRN
  Administered 2014-03-17: 1500 mL

## 2014-03-17 MED ORDER — ONDANSETRON HCL 4 MG/2ML IJ SOLN
INTRAMUSCULAR | Status: DC | PRN
  Administered 2014-03-17: 4 mg via INTRAVENOUS

## 2014-03-17 MED ORDER — EPHEDRINE SULFATE 50 MG/ML IJ SOLN
INTRAMUSCULAR | Status: AC
  Filled 2014-03-17: qty 1

## 2014-03-17 MED ORDER — OXYCODONE HCL 5 MG PO TABS
5.0000 mg | ORAL_TABLET | ORAL | Status: DC | PRN
  Administered 2014-03-17: 10 mg via ORAL
  Administered 2014-03-18: 5 mg via ORAL
  Administered 2014-03-18 (×4): 10 mg via ORAL
  Administered 2014-03-19: 5 mg via ORAL
  Administered 2014-03-19 – 2014-03-20 (×5): 10 mg via ORAL
  Filled 2014-03-17 (×9): qty 2
  Filled 2014-03-17: qty 1
  Filled 2014-03-17 (×2): qty 2

## 2014-03-17 MED ORDER — TRANEXAMIC ACID 100 MG/ML IV SOLN
1000.0000 mg | INTRAVENOUS | Status: AC
  Administered 2014-03-17: 1000 mg via INTRAVENOUS
  Filled 2014-03-17: qty 10

## 2014-03-17 MED ORDER — DEXAMETHASONE SODIUM PHOSPHATE 10 MG/ML IJ SOLN
10.0000 mg | Freq: Every day | INTRAMUSCULAR | Status: AC
  Filled 2014-03-17: qty 1

## 2014-03-17 MED ORDER — FLEET ENEMA 7-19 GM/118ML RE ENEM: 1.0000 | ENEMA | Freq: Once | RECTAL | Status: AC | PRN

## 2014-03-17 MED ORDER — ENOXAPARIN SODIUM 30 MG/0.3ML ~~LOC~~ SOLN
30.0000 mg | Freq: Two times a day (BID) | SUBCUTANEOUS | Status: DC
  Administered 2014-03-18 – 2014-03-20 (×5): 30 mg via SUBCUTANEOUS
  Filled 2014-03-17 (×6): qty 0.3

## 2014-03-17 MED ORDER — MORPHINE SULFATE 2 MG/ML IJ SOLN: 1.0000 mg | INTRAMUSCULAR | Status: DC | PRN

## 2014-03-17 MED ORDER — PHENOL 1.4 % MT LIQD: 1.0000 | OROMUCOSAL | Status: DC | PRN

## 2014-03-17 MED ORDER — DILTIAZEM HCL ER COATED BEADS 180 MG PO CP24
180.0000 mg | ORAL_CAPSULE | Freq: Every morning | ORAL | Status: DC
  Administered 2014-03-17 – 2014-03-20 (×4): 180 mg via ORAL
  Filled 2014-03-17 (×4): qty 1

## 2014-03-17 MED ORDER — BUPIVACAINE LIPOSOME 1.3 % IJ SUSP
INTRAMUSCULAR | Status: DC | PRN
  Administered 2014-03-17: 20 mL

## 2014-03-17 MED ORDER — KETOROLAC TROMETHAMINE 15 MG/ML IJ SOLN
7.5000 mg | Freq: Four times a day (QID) | INTRAMUSCULAR | Status: AC | PRN
  Filled 2014-03-17: qty 1

## 2014-03-17 MED ORDER — PROPOFOL 10 MG/ML IV BOLUS
INTRAVENOUS | Status: AC
  Filled 2014-03-17: qty 20

## 2014-03-17 MED ORDER — BUPIVACAINE HCL 0.25 % IJ SOLN
INTRAMUSCULAR | Status: DC | PRN
  Administered 2014-03-17: 25 mL

## 2014-03-17 MED ORDER — DEXAMETHASONE 6 MG PO TABS
10.0000 mg | ORAL_TABLET | Freq: Every day | ORAL | Status: AC
  Administered 2014-03-18: 10 mg via ORAL
  Filled 2014-03-17: qty 1

## 2014-03-17 MED ORDER — PROMETHAZINE HCL 25 MG/ML IJ SOLN: 6.2500 mg | INTRAMUSCULAR | Status: DC | PRN

## 2014-03-17 MED ORDER — ACETAMINOPHEN 500 MG PO TABS
1000.0000 mg | ORAL_TABLET | Freq: Four times a day (QID) | ORAL | Status: AC
  Administered 2014-03-17 – 2014-03-18 (×4): 1000 mg via ORAL
  Filled 2014-03-17 (×4): qty 2

## 2014-03-17 MED ORDER — METOCLOPRAMIDE HCL 10 MG PO TABS: 5.0000 mg | ORAL_TABLET | Freq: Three times a day (TID) | ORAL | Status: DC | PRN

## 2014-03-17 MED ORDER — METHOCARBAMOL 500 MG PO TABS
500.0000 mg | ORAL_TABLET | Freq: Four times a day (QID) | ORAL | Status: DC | PRN
  Administered 2014-03-17 – 2014-03-20 (×7): 500 mg via ORAL
  Filled 2014-03-17 (×7): qty 1

## 2014-03-17 MED ORDER — OXYCODONE HCL 5 MG/5ML PO SOLN
5.0000 mg | Freq: Once | ORAL | Status: DC | PRN
  Filled 2014-03-17: qty 5

## 2014-03-17 MED ORDER — CEFAZOLIN SODIUM-DEXTROSE 2-3 GM-% IV SOLR
2.0000 g | Freq: Four times a day (QID) | INTRAVENOUS | Status: AC
  Administered 2014-03-17 – 2014-03-18 (×2): 2 g via INTRAVENOUS
  Filled 2014-03-17 (×2): qty 50

## 2014-03-17 MED ORDER — ACETAMINOPHEN 325 MG PO TABS
650.0000 mg | ORAL_TABLET | Freq: Four times a day (QID) | ORAL | Status: DC | PRN
  Administered 2014-03-19: 650 mg via ORAL
  Filled 2014-03-17: qty 2

## 2014-03-17 MED ORDER — SERTRALINE HCL 25 MG PO TABS
25.0000 mg | ORAL_TABLET | Freq: Every morning | ORAL | Status: DC
  Administered 2014-03-18 – 2014-03-20 (×3): 25 mg via ORAL
  Filled 2014-03-17 (×3): qty 1

## 2014-03-17 MED ORDER — PROPOFOL 10 MG/ML IV BOLUS
INTRAVENOUS | Status: DC | PRN
  Administered 2014-03-17: 20 mg via INTRAVENOUS
  Administered 2014-03-17: 10 mg via INTRAVENOUS
  Administered 2014-03-17: 20 mg via INTRAVENOUS

## 2014-03-17 MED ORDER — POLYETHYLENE GLYCOL 3350 17 G PO PACK: 17.0000 g | PACK | Freq: Every day | ORAL | Status: DC | PRN

## 2014-03-17 MED ORDER — PROPOFOL INFUSION 10 MG/ML OPTIME
INTRAVENOUS | Status: DC | PRN
  Administered 2014-03-17: 25 ug/kg/min via INTRAVENOUS

## 2014-03-17 MED ORDER — BUPIVACAINE LIPOSOME 1.3 % IJ SUSP
20.0000 mL | Freq: Once | INTRAMUSCULAR | Status: DC
Start: 2014-03-17 — End: 2014-03-17
  Filled 2014-03-17: qty 20

## 2014-03-17 MED ORDER — DOCUSATE SODIUM 100 MG PO CAPS
100.0000 mg | ORAL_CAPSULE | Freq: Two times a day (BID) | ORAL | Status: DC
  Administered 2014-03-17 – 2014-03-20 (×6): 100 mg via ORAL

## 2014-03-17 MED ORDER — IRBESARTAN 300 MG PO TABS
300.0000 mg | ORAL_TABLET | Freq: Every morning | ORAL | Status: DC
  Administered 2014-03-18 – 2014-03-20 (×3): 300 mg via ORAL
  Filled 2014-03-17 (×3): qty 1

## 2014-03-17 MED ORDER — CEFAZOLIN SODIUM-DEXTROSE 2-3 GM-% IV SOLR
2.0000 g | INTRAVENOUS | Status: AC
  Administered 2014-03-17: 2 g via INTRAVENOUS

## 2014-03-17 MED ORDER — BUPIVACAINE HCL (PF) 0.25 % IJ SOLN
INTRAMUSCULAR | Status: AC
  Filled 2014-03-17: qty 30

## 2014-03-17 MED ORDER — EPHEDRINE SULFATE 50 MG/ML IJ SOLN
INTRAMUSCULAR | Status: DC | PRN
  Administered 2014-03-17: 5 mg via INTRAVENOUS

## 2014-03-17 SURGICAL SUPPLY — 58 items
BAG SPEC THK2 15X12 ZIP CLS (MISCELLANEOUS) ×1
BAG ZIPLOCK 12X15 (MISCELLANEOUS) ×3 IMPLANT
BANDAGE ELASTIC 6 VELCRO ST LF (GAUZE/BANDAGES/DRESSINGS) ×3 IMPLANT
BANDAGE ESMARK 6X9 LF (GAUZE/BANDAGES/DRESSINGS) ×1 IMPLANT
BLADE SAG 18X100X1.27 (BLADE) ×3 IMPLANT
BLADE SAW SGTL 11.0X1.19X90.0M (BLADE) ×3 IMPLANT
BNDG CMPR 9X6 STRL LF SNTH (GAUZE/BANDAGES/DRESSINGS) ×1
BNDG ESMARK 6X9 LF (GAUZE/BANDAGES/DRESSINGS) ×3
BOWL SMART MIX CTS (DISPOSABLE) ×3 IMPLANT
CAPT RP KNEE ×2 IMPLANT
CEMENT HV SMART SET (Cement) ×6 IMPLANT
CLOSURE WOUND 1/2 X4 (GAUZE/BANDAGES/DRESSINGS) ×2
CUFF TOURN SGL QUICK 34 (TOURNIQUET CUFF) ×3
CUFF TRNQT CYL 34X4X40X1 (TOURNIQUET CUFF) ×1 IMPLANT
DECANTER SPIKE VIAL GLASS SM (MISCELLANEOUS) ×3 IMPLANT
DRAPE EXTREMITY T 121X128X90 (DRAPE) ×3 IMPLANT
DRAPE POUCH INSTRU U-SHP 10X18 (DRAPES) ×3 IMPLANT
DRAPE U-SHAPE 47X51 STRL (DRAPES) ×3 IMPLANT
DRSG ADAPTIC 3X8 NADH LF (GAUZE/BANDAGES/DRESSINGS) ×3 IMPLANT
DRSG PAD ABDOMINAL 8X10 ST (GAUZE/BANDAGES/DRESSINGS) ×3 IMPLANT
DURAPREP 26ML APPLICATOR (WOUND CARE) ×3 IMPLANT
ELECT REM PT RETURN 9FT ADLT (ELECTROSURGICAL) ×3
ELECTRODE REM PT RTRN 9FT ADLT (ELECTROSURGICAL) ×1 IMPLANT
EVACUATOR 1/8 PVC DRAIN (DRAIN) ×3 IMPLANT
FACESHIELD LNG OPTICON STERILE (SAFETY) ×15 IMPLANT
GLOVE BIO SURGEON STRL SZ7.5 (GLOVE) IMPLANT
GLOVE BIO SURGEON STRL SZ8 (GLOVE) ×3 IMPLANT
GLOVE BIOGEL PI IND STRL 8 (GLOVE) ×2 IMPLANT
GLOVE BIOGEL PI INDICATOR 8 (GLOVE) ×4
GLOVE SURG SS PI 6.5 STRL IVOR (GLOVE) IMPLANT
GOWN STRL REUS W/TWL LRG LVL3 (GOWN DISPOSABLE) ×3 IMPLANT
GOWN STRL REUS W/TWL XL LVL3 (GOWN DISPOSABLE) IMPLANT
HANDPIECE INTERPULSE COAX TIP (DISPOSABLE) ×3
IMMOBILIZER KNEE 20 (SOFTGOODS) ×3 IMPLANT
KIT BASIN OR (CUSTOM PROCEDURE TRAY) ×3 IMPLANT
MANIFOLD NEPTUNE II (INSTRUMENTS) ×3 IMPLANT
NDL SAFETY ECLIPSE 18X1.5 (NEEDLE) ×2 IMPLANT
NEEDLE HYPO 18GX1.5 SHARP (NEEDLE) ×6
NS IRRIG 1000ML POUR BTL (IV SOLUTION) ×3 IMPLANT
PACK TOTAL JOINT (CUSTOM PROCEDURE TRAY) ×3 IMPLANT
PAD ABD 8X10 STRL (GAUZE/BANDAGES/DRESSINGS) ×4 IMPLANT
PADDING CAST COTTON 6X4 STRL (CAST SUPPLIES) ×7 IMPLANT
POSITIONER SURGICAL ARM (MISCELLANEOUS) ×3 IMPLANT
SET HNDPC FAN SPRY TIP SCT (DISPOSABLE) ×1 IMPLANT
SPONGE GAUZE 4X4 12PLY (GAUZE/BANDAGES/DRESSINGS) ×3 IMPLANT
STRIP CLOSURE SKIN 1/2X4 (GAUZE/BANDAGES/DRESSINGS) ×4 IMPLANT
SUCTION FRAZIER 12FR DISP (SUCTIONS) ×3 IMPLANT
SUT MNCRL AB 4-0 PS2 18 (SUTURE) ×3 IMPLANT
SUT VIC AB 2-0 CT1 27 (SUTURE) ×9
SUT VIC AB 2-0 CT1 TAPERPNT 27 (SUTURE) ×3 IMPLANT
SUT VLOC 180 0 24IN GS25 (SUTURE) ×3 IMPLANT
SYR 20CC LL (SYRINGE) ×3 IMPLANT
SYR 50ML LL SCALE MARK (SYRINGE) ×3 IMPLANT
TOWEL OR 17X26 10 PK STRL BLUE (TOWEL DISPOSABLE) ×3 IMPLANT
TOWEL OR NON WOVEN STRL DISP B (DISPOSABLE) IMPLANT
TRAY FOLEY CATH 14FRSI W/METER (CATHETERS) ×3 IMPLANT
WATER STERILE IRR 1500ML POUR (IV SOLUTION) ×3 IMPLANT
WRAP KNEE MAXI GEL POST OP (GAUZE/BANDAGES/DRESSINGS) ×3 IMPLANT

## 2014-03-17 NOTE — Interval H&P Note (Signed)
History and Physical Interval Note:  03/17/2014 8:39 AM  Joshua Rich  has presented today for surgery, with the diagnosis of OA OF RIGHT KNEE  The various methods of treatment have been discussed with the patient and family. After consideration of risks, benefits and other options for treatment, the patient has consented to  Procedure(s): RIGHT TOTAL KNEE ARTHROPLASTY (Right) as a surgical intervention .  The patient's history has been reviewed, patient examined, no change in status, stable for surgery.  I have reviewed the patient's chart and labs.  Questions were answered to the patient's satisfaction.     Joshua Rich,Joshua Rich

## 2014-03-17 NOTE — Progress Notes (Signed)
Utilization review completed.  

## 2014-03-17 NOTE — Preoperative (Signed)
Beta Blockers   Reason not to administer Beta Blockers:Not Applicable, took coreg 03/17/14 am.

## 2014-03-17 NOTE — Op Note (Signed)
Pre-operative diagnosis- Osteoarthritis  Right knee(s)  Post-operative diagnosis- Osteoarthritis Right knee(s)  Procedure-  Right  Total Knee Arthroplasty  Surgeon- Gus Rankin. Jakera Beaupre, MD  Assistant- Avel Peace, PA-C   Anesthesia-  Spinal EBL-* No blood loss amount entered *  Drains Hemovac  Tourniquet time-  Total Tourniquet Time Documented: Thigh (Right) - 32 minutes Total: Thigh (Right) - 32 minutes    Complications- None  Condition-PACU - hemodynamically stable.   Brief Clinical Note  Joshua Rich is a 79 y.o. year old male with end stage OA of his right knee with progressively worsening pain and dysfunction. He has constant pain, with activity and at rest and significant functional deficits with difficulties even with ADLs. He has had extensive non-op management including analgesics, injections of cortisone, and home exercise program, but remains in significant pain with significant dysfunction. Radiographs show bone on bone arthritis medial and patellofemoral with large medial osteophytes. He presents now for right Total Knee Arthroplasty.    Procedure in detail---   The patient is brought into the operating room and positioned supine on the operating table. After successful administration of  Spinal,   a tourniquet is placed high on the  Right thigh(s) and the lower extremity is prepped and draped in the usual sterile fashion. Time out is performed by the operating team and then the  Right lower extremity is wrapped in Esmarch, knee flexed and the tourniquet inflated to 300 mmHg.       A midline incision is made with a ten blade through the subcutaneous tissue to the level of the extensor mechanism. A fresh blade is used to make a medial parapatellar arthrotomy. Soft tissue over the proximal medial tibia is subperiosteally elevated to the joint line with a knife and into the semimembranosus bursa with a Cobb elevator. Soft tissue over the proximal lateral tibia is elevated with  attention being paid to avoiding the patellar tendon on the tibial tubercle. The patella is everted, knee flexed 90 degrees and the ACL and PCL are removed. Findings are bone on bone media and patellofemoral with large medial osteophytes.        The drill is used to create a starting hole in the distal femur and the canal is thoroughly irrigated with sterile saline to remove the fatty contents. The 5 degree Right  valgus alignment guide is placed into the femoral canal and the distal femoral cutting block is pinned to remove 10 mm off the distal femur. Resection is made with an oscillating saw.      The tibia is subluxed forward and the menisci are removed. The extramedullary alignment guide is placed referencing proximally at the medial aspect of the tibial tubercle and distally along the second metatarsal axis and tibial crest. The block is pinned to remove 2mm off the more deficient medial  side. Resection is made with an oscillating saw. Size 4is the most appropriate size for the tibia and the proximal tibia is prepared with the modular drill and keel punch for that size.      The femoral sizing guide is placed and size 4 is most appropriate. Rotation is marked off the epicondylar axis and confirmed by creating a rectangular flexion gap at 90 degrees. The size 4 cutting block is pinned in this rotation and the anterior, posterior and chamfer cuts are made with the oscillating saw. The intercondylar block is then placed and that cut is made.      Trial size 4 tibial component, trial  size 4 posterior stabilized femur and a 15  mm posterior stabilized rotating platform insert trial is placed. Full extension is achieved with excellent varus/valgus and anterior/posterior balance throughout full range of motion. The patella is everted and thickness measured to be 27  mm. Free hand resection is taken to 15 mm, a 41 template is placed, lug holes are drilled, trial patella is placed, and it tracks normally.  Osteophytes are removed off the posterior femur with the trial in place. All trials are removed and the cut bone surfaces prepared with pulsatile lavage. Cement is mixed and once ready for implantation, the size 4 tibial implant, size  4 posterior stabilized femoral component, and the size 41 patella are cemented in place and the patella is held with the clamp. The trial insert is placed and the knee held in full extension. The Exparel (20 ml mixed with 30 ml saline) and .25% Bupivicaine, are injected into the extensor mechanism, posterior capsule, medial and lateral gutters and subcutaneous tissues.  All extruded cement is removed and once the cement is hard the permanent 15 mm posterior stabilized rotating platform insert is placed into the tibial tray.      The wound is copiously irrigated with saline solution and the extensor mechanism closed over a hemovac drain with #1 PDS suture. The tourniquet is released for a total tourniquet time of 32  minutes. Flexion against gravity is 140 degrees and the patella tracks normally. Subcutaneous tissue is closed with 2.0 vicryl and subcuticular with running 4.0 Monocryl. The incision is cleaned and dried and steri-strips and a bulky sterile dressing are applied. The limb is placed into a knee immobilizer and the patient is awakened and transported to recovery in stable condition.      Please note that a surgical assistant was a medical necessity for this procedure in order to perform it in a safe and expeditious manner. Surgical assistant was necessary to retract the ligaments and vital neurovascular structures to prevent injury to them and also necessary for proper positioning of the limb to allow for anatomic placement of the prosthesis.   Gus RankinFrank V. Janis Cuffe, MD    03/17/2014, 11:57 AM

## 2014-03-17 NOTE — Anesthesia Preprocedure Evaluation (Signed)
Anesthesia Evaluation  Patient identified by MRN, date of birth, ID band Patient awake    Reviewed: Allergy & Precautions, H&P , NPO status , Patient's Chart, lab work & pertinent test results  Airway Mallampati: II TM Distance: >3 FB Neck ROM: Full    Dental  (+) Dental Advisory Given   Pulmonary sleep apnea , former smoker,  breath sounds clear to auscultation        Cardiovascular hypertension, Pt. on medications negative cardio ROS  Rhythm:Regular Rate:Normal     Neuro/Psych negative neurological ROS  negative psych ROS   GI/Hepatic negative GI ROS, Neg liver ROS,   Endo/Other  diabetes, Type 2, Oral Hypoglycemic Agents  Renal/GU negative Renal ROS     Musculoskeletal negative musculoskeletal ROS (+)   Abdominal   Peds  Hematology negative hematology ROS (+)   Anesthesia Other Findings   Reproductive/Obstetrics negative OB ROS                           Anesthesia Physical Anesthesia Plan  ASA: II  Anesthesia Plan:    Post-op Pain Management:    Induction:   Airway Management Planned:   Additional Equipment:   Intra-op Plan:   Post-operative Plan:   Informed Consent: I have reviewed the patients History and Physical, chart, labs and discussed the procedure including the risks, benefits and alternatives for the proposed anesthesia with the patient or authorized representative who has indicated his/her understanding and acceptance.   Dental advisory given  Plan Discussed with: CRNA  Anesthesia Plan Comments:         Anesthesia Quick Evaluation

## 2014-03-17 NOTE — Transfer of Care (Signed)
Immediate Anesthesia Transfer of Care Note  Patient: Joshua Rich  Procedure(s) Performed: Procedure(s) (LRB): RIGHT TOTAL KNEE ARTHROPLASTY (Right)  Patient Location: PACU  Anesthesia Type: Spinal  Level of Consciousness: sedated, patient cooperative and responds to stimulation  Airway & Oxygen Therapy: Patient Spontanous Breathing and Patient connected to face mask oxgen  Post-op Assessment: Report given to PACU RN and Post -op Vital signs reviewed and stable  Post vital signs: Reviewed and stable  Complications: No apparent anesthesia complications

## 2014-03-17 NOTE — Progress Notes (Signed)
Clinical Social Work Department BRIEF PSYCHOSOCIAL ASSESSMENT 03/17/2014  Patient:  Joshua Rich, Joshua Rich     Account Number:  [id number]     Admit date:  03/17/2014  Clinical Social Worker:  Lacie Scotts  Date/Time:  03/17/2014 02:48 PM  Referred by:  Physician  Date Referred:  03/17/2014 Referred for  SNF Placement   Other Referral:   Interview type:  Patient Other interview type:    PSYCHOSOCIAL DATA Living Status:  ALONE Admitted from facility:   Level of care:   Primary support name:  Salina April Primary support relationship to patient:  FRIEND Degree of support available:   unclear    CURRENT CONCERNS Current Concerns  Post-Acute Placement   Other Concerns:    SOCIAL WORK ASSESSMENT / PLAN Pt is a 79 yr old gentleman living at home prior to hospitalization. CSW met with pt to assist with d/c planning. Pt had contacted [facility] last week to request rehab placement following hospital d/c. CSW has contacted SNF to confirm plan. Awaiting return call from [facility]. CSW will continue to follow to assist with d/c planning to SNF.   Assessment/plan status:  Psychosocial Support/Ongoing Assessment of Needs Other assessment/ plan:   Information/referral to community resources:   Insurance coverage for SNF reviewed.    PATIENT'S/FAMILY'S RESPONSE TO PLAN OF CARE: Pt is pleased his surgery is done. He is tired but comfortable at the moment. Pt hopes there will be a bed available at [facility] when he is ready for d/c.   Werner Lean LCSW [phone]

## 2014-03-17 NOTE — Progress Notes (Signed)
Clinical Social Work Department CLINICAL SOCIAL WORK PLACEMENT NOTE 03/17/2014  Patient:  Joshua Rich,Joshua Rich  Account Number:  [id number] Admit date:  03/17/2014  Clinical Social Worker:  Cori RazorJAMIE Ange Puskas, LCSW  Date/time:  03/17/2014 03:02 PM  Clinical Social Work is seeking post-discharge placement for this patient at the following level of care:   SKILLED NURSING   (*CSW will update this form in Epic as items are completed)     Patient/family provided with [facility] Department of Clinical Social Work's list of facilities offering this level of care within the geographic area requested by the patient (or if unable, by the patient's family).  03/17/2014  Patient/family informed of their freedom to choose among providers that offer the needed level of care, that participate in Medicare, Medicaid or managed care program needed by the patient, have an available bed and are willing to accept the patient.    Patient/family informed of [facility]' ownership interest in [facility], as well as of the fact that they are under no obligation to receive care at this facility.  PASARR submitted to EDS on 03/17/2014 PASARR number received from EDS on 03/17/2014  FL2 transmitted to all facilities in geographic area requested by pt/family on  03/17/2014 FL2 transmitted to all facilities within larger geographic area on   Patient informed that his/her managed care company has contracts with or will negotiate with  certain facilities, including the following:     Patient/family informed of bed offers received:   Patient chooses bed at  Physician recommends and patient chooses bed at    Patient to be transferred to  on   Patient to be transferred to facility by   The following physician request were entered in Epic:   Additional Comments:  Cori RazorJamie Devell Parkerson LCSW [phone]

## 2014-03-17 NOTE — Anesthesia Postprocedure Evaluation (Signed)
Anesthesia Post Note  Patient: Joshua Rich  Procedure(s) Performed: Procedure(s) (LRB): RIGHT TOTAL KNEE ARTHROPLASTY (Right)  Anesthesia type: Spinal  Patient location: PACU  Post pain: Pain level controlled  Post assessment: Post-op Vital signs reviewed  Last Vitals: BP 142/58  Pulse 55  Temp(Src) 36.3 C (Oral)  Resp 15  SpO2 99%  Post vital signs: Reviewed  Level of consciousness: sedated  Complications: No apparent anesthesia complications

## 2014-03-17 NOTE — Anesthesia Procedure Notes (Addendum)
Spinal  Patient location during procedure: OR Start time: 03/17/2014 10:55 AM End time: 03/17/2014 11:02 AM Staffing Anesthesiologist: Nolon Nations R Performed by: anesthesiologist  Preanesthetic Checklist Completed: patient identified, site marked, surgical consent, pre-op evaluation, timeout performed, IV checked, risks and benefits discussed and monitors and equipment checked Spinal Block Patient position: sitting Prep: Betadine Patient monitoring: heart rate, continuous pulse ox and blood pressure Approach: midline Location: L2-3 Injection technique: single-shot Needle Needle type: Quincke  Needle gauge: 22 G Needle length: 9 cm Assessment Sensory level: T8 Additional Notes Expiration date of kit checked and confirmed. Patient tolerated procedure well, without complications. First attempt by [facility], left paramedian with possible CSF flash, no flow.

## 2014-03-18 DIAGNOSIS — D62 Acute posthemorrhagic anemia: Secondary | ICD-10-CM | POA: Diagnosis not present

## 2014-03-18 LAB — BASIC METABOLIC PANEL
BUN: 11 mg/dL (ref 6–23)
CO2: 28 mEq/L (ref 19–32)
Calcium: 8.5 mg/dL (ref 8.4–10.5)
Chloride: 100 mEq/L (ref 96–112)
Creatinine, Ser: 0.88 mg/dL (ref 0.50–1.35)
GFR, EST NON AFRICAN AMERICAN: 81 mL/min — AB (ref >90)
Glucose, Bld: 233 mg/dL — ABNORMAL HIGH (ref 70–99)
POTASSIUM: 3.8 meq/L (ref 3.7–5.3)
SODIUM: 137 meq/L (ref 137–147)

## 2014-03-18 LAB — CBC
HCT: 33.5 % — ABNORMAL LOW (ref 39.0–52.0)
Hemoglobin: 11.6 g/dL — ABNORMAL LOW (ref 13.0–17.0)
MCH: 30.3 pg (ref 26.0–34.0)
MCHC: 34.6 g/dL (ref 30.0–36.0)
MCV: 87.5 fL (ref 78.0–100.0)
PLATELETS: 157 10*3/uL (ref 150–400)
RBC: 3.83 MIL/uL — ABNORMAL LOW (ref 4.22–5.81)
RDW: 12.9 % (ref 11.5–15.5)
WBC: 9.4 10*3/uL (ref 4.0–10.5)

## 2014-03-18 LAB — GLUCOSE, CAPILLARY
GLUCOSE-CAPILLARY: 314 mg/dL — AB (ref 70–99)
Glucose-Capillary: 218 mg/dL — ABNORMAL HIGH (ref 70–99)
Glucose-Capillary: 190 mg/dL — ABNORMAL HIGH (ref 70–99)
Glucose-Capillary: 293 mg/dL — ABNORMAL HIGH (ref 70–99)

## 2014-03-18 MED ORDER — ALUM & MAG HYDROXIDE-SIMETH 200-200-20 MG/5ML PO SUSP
30.0000 mL | ORAL | Status: DC | PRN
  Administered 2014-03-18 – 2014-03-19 (×2): 30 mL via ORAL
  Filled 2014-03-18 (×2): qty 30

## 2014-03-18 NOTE — Evaluation (Signed)
Physical Therapy Evaluation Patient Details Name: Regis BillJohn W Dittrich MRN: [id number] DOB: 02/27/1935 Today's Date: 03/18/2014   History of Present Illness  RTKA  Clinical Impression  Pt ambulated x 40', plans for DC to snf. Pt will benefit from PT to address  Problems.  Follow Up Recommendations SNF    Equipment Recommendations  None recommended by PT    Recommendations for Other Services       Precautions / Restrictions Precautions Precautions: Fall;Knee Required Braces or Orthoses: Knee Immobilizer - Right Knee Immobilizer - Right: Discontinue once straight leg raise with < 10 degree lag      Mobility  Bed Mobility Overal bed mobility: Needs Assistance Bed Mobility: Supine to Sit     Supine to sit: Min assist     General bed mobility comments: cues for sequence,and support of R leg.  Transfers Overall transfer level: Needs assistance Equipment used: Rolling walker (2 wheeled) Transfers: Sit to/from Stand Sit to Stand: Min assist;From elevated surface;+2 safety/equipment         General transfer comment: cues for sequence and safety for standing at RW.  Ambulation/Gait Ambulation/Gait assistance: Mod assist;+2 safety/equipment Ambulation Distance (Feet): 40 Feet Assistive device: Rolling walker (2 wheeled) Gait Pattern/deviations: Step-to pattern;Antalgic;Trunk flexed Gait velocity: decreased   General Gait Details: cues for sequence and posture  Stairs            Wheelchair Mobility    Modified Rankin (Stroke Patients Only)       Balance                                     Pertinent Vitals/Pain R knee 4  With weight    Home Living Family/patient expects to be discharged to:: Skilled nursing facility Living Arrangements: Alone                    Prior Function Level of Independence: Independent with assistive device(s)               Hand Dominance        Extremity/Trunk Assessment   Upper Extremity  Assessment: RUE deficits/detail RUE Deficits / Details: deficits of R intrinsics, decreased long finger extension    RUE Sensation: decreased light touch     Lower Extremity Assessment: RLE deficits/detail RLE Deficits / Details: able to perform SLR       Communication   Communication: No difficulties  Cognition Arousal/Alertness: Awake/alert Behavior During Therapy: WFL for tasks assessed/performed Overall Cognitive Status: Within Functional Limits for tasks assessed                      General Comments      Exercises Total Joint Exercises Straight Leg Raises: AAROM;Right;5 reps;Supine      Assessment/Plan    PT Assessment Patient needs continued PT services  PT Diagnosis Difficulty walking;Acute pain   PT Problem List Decreased strength;Decreased range of motion;Decreased activity tolerance;Decreased mobility;Decreased safety awareness;Decreased knowledge of precautions;Decreased knowledge of use of DME;Pain  PT Treatment Interventions DME instruction;Gait training;Functional mobility training;Therapeutic activities;Therapeutic exercise;Patient/family education   PT Goals (Current goals can be found in the Care Plan section) Acute Rehab PT Goals Patient Stated Goal: to walk without pain. PT Goal Formulation: With patient Time For Goal Achievement: 03/25/14 Potential to Achieve Goals: Good    Frequency 7X/week   Barriers to discharge Decreased caregiver support      End  of Session Equipment Utilized During Treatment: Gait belt Activity Tolerance: Patient limited by fatigue Patient left: in chair;with call bell/phone within reach         Time: 0907-0922 PT Time Calculation (min): 15 min   Charges:   PT Evaluation $Initial PT Evaluation Tier I: 1 Procedure PT Treatments $Gait Training: 8-22 mins   PT G Codes:          Rada Hay 03/18/2014, 12:35 PM

## 2014-03-18 NOTE — Progress Notes (Signed)
OT Cancellation Note  Patient Details Name: Regis BillJohn W Schlag MRN: [id number] DOB: 09/26/1935   Cancelled Treatment:    Reason Eval/Treat Not Completed: Other (comment).  Pt plans snf for rehab.  OT eval is not needed for admission.  Will defer OT to next venue.  Quianna Avery 03/18/2014, 9:01 AM Marica OtterMaryellen Irving Bloor, OTR/L [phone] 03/18/2014

## 2014-03-18 NOTE — Progress Notes (Signed)
Physical Therapy Treatment Patient Details Name: Joshua BillJohn W Rich MRN: [id number] DOB: 09/17/1935 Today's Date: 03/18/2014    History of Present Illness RTKA    PT Comments    POD # 1 pm session.  Amb in hallway limited distance then assisted back to bed to perform TKR TE's followed by ICE.  Pt progressing slowly and will need ST Rehab at SNF.   Follow Up Recommendations  SNF     Equipment Recommendations  None recommended by PT    Recommendations for Other Services       Precautions / Restrictions Precautions Precautions: Fall;Knee Required Braces or Orthoses: Knee Immobilizer - Right Knee Immobilizer - Right: Discontinue once straight leg raise with < 10 degree lag Restrictions Weight Bearing Restrictions: No    Mobility  Bed Mobility Overal bed mobility: Needs Assistance Bed Mobility: Sit to Supine           General bed mobility comments: assisted back to bed for CPM  Transfers Overall transfer level: Needs assistance Equipment used: Rolling walker (2 wheeled) Transfers: Sit to/from Stand Sit to Stand: Min assist;From elevated surface;+2 safety/equipment         General transfer comment: cues for sequence and safety for standing at RW.  Ambulation/Gait Ambulation/Gait assistance: Min assist;Mod assist Ambulation Distance (Feet): 38 Feet Assistive device: Rolling walker (2 wheeled) Gait Pattern/deviations: Step-to pattern;Trunk flexed Gait velocity: decreased   General Gait Details: cues for sequence and posture with proper walker to self distance.   Stairs            Wheelchair Mobility    Modified Rankin (Stroke Patients Only)       Balance                                    Cognition                            Exercises   Total Knee Replacement TE's 10 reps B LE ankle pumps 10 reps towel squeezes 10 reps knee presses 10 reps heel slides  10 reps SAQ's 10 reps SLR's 10 reps ABD Followed by ICE      General Comments        Pertinent Vitals/Pain     Home Living                      Prior Function            PT Goals (current goals can now be found in the care plan section) Progress towards PT goals: Progressing toward goals    Frequency  7X/week    PT Plan      End of Session Equipment Utilized During Treatment: Gait belt Activity Tolerance: Patient limited by fatigue;Patient limited by pain Patient left: in bed;with call bell/phone within reach     Time: 1345-1410 PT Time Calculation (min): 25 min  Charges:  $Gait Training: 8-22 mins $Therapeutic Exercise: 8-22 mins                    G Codes:      Felecia ShellingLori Haroldine Redler  PTA [facility]  Acute  Rehab Pager      [phone]

## 2014-03-18 NOTE — Discharge Instructions (Addendum)

## 2014-03-18 NOTE — Progress Notes (Signed)
03/18/2014 1225 NCM spoke to pt and lives alone. He prefers SNF post dc for rehab. Isidoro DonningAlesia Jacoby Zanni RN  CCM Case Mgmt phone [phone]

## 2014-03-18 NOTE — Progress Notes (Signed)
Inpatient Diabetes Program Recommendations  AACE/ADA: New Consensus Statement on Inpatient Glycemic Control (2013)  Target Ranges:  Prepandial:   less than 140 mg/dL      Peak postprandial:   less than 180 mg/dL (1-2 hours)      Critically ill patients:  140 - 180 mg/dL     Results for Joshua Rich, Joshua Rich (MRN [id number]) as of 03/18/2014 09:48  Ref. Range 03/17/2014 08:02 03/17/2014 17:56 03/17/2014 21:34  Glucose-Capillary Latest Range: 70-99 mg/dL 409124 (H) 811268 (H) 914253 (H)    Results for Joshua Rich, Joshua Rich (MRN [id number]) as of 03/18/2014 09:48  Ref. Range 03/18/2014 07:22  Glucose-Capillary Latest Range: 70-99 mg/dL 086218 (H)     **Pt currently only receiving home dose of Amaryl  **MD- Please add Novolog Moderate SSI tid ac + HS   Will follow. Ambrose FinlandJeannine Johnston [place] Shellhammer RN, MSN, CDE Diabetes Coordinator Inpatient Diabetes Program Team Pager: [phone] (8a-10p)

## 2014-03-18 NOTE — Progress Notes (Signed)
   Subjective: 1 Day Post-Op Procedure(s) (LRB): RIGHT TOTAL KNEE ARTHROPLASTY (Right) Patient reports pain as mild.   Patient seen in rounds with Dr. Lequita HaltAluisio.  No sleep last night but pain is controlled. Patient is well, but has had some minor complaints of pain in the knee, requiring pain medications We will start therapy today.  Plan is to go [facility] after hospital stay.  Objective: Vital signs in last 24 hours: Temp:  [97.4 F (36.3 C)-98.4 F (36.9 C)] 98.4 F (36.9 C) (03/24 0645) Pulse Rate:  [50-73] 73 (03/24 0645) Resp:  [9-16] 16 (03/24 0645) BP: (126-186)/(49-94) 156/85 mmHg (03/24 0645) SpO2:  [96 %-100 %] 98 % (03/24 0645) Weight:  [90.81 kg (200 lb 3.2 oz)] 90.81 kg (200 lb 3.2 oz) (03/23 1500)  Intake/Output from previous day:  Intake/Output Summary (Last 24 hours) at 03/18/14 0815 Last data filed at 03/18/14 0645  Gross per 24 hour  Intake 3266.67 ml  Output   1800 ml  Net 1466.67 ml    Intake/Output this shift:    Labs:  Recent Labs  03/18/14 0411  HGB 11.6*    Recent Labs  03/18/14 0411  WBC 9.4  RBC 3.83*  HCT 33.5*  PLT 157    Recent Labs  03/18/14 0411  NA 137  K 3.8  CL 100  CO2 28  BUN 11  CREATININE 0.88  GLUCOSE 233*  CALCIUM 8.5   No results found for this basename: LABPT, INR,  in the last 72 hours  EXAM General - Patient is Alert, Appropriate and Oriented Extremity - Neurovascular intact Sensation intact distally Dorsiflexion/Plantar flexion intact Dressing - dressing C/D/I Motor Function - intact, moving foot and toes well on exam.  Hemovac pulled without difficulty.  Past Medical History  Diagnosis Date  . Hypertension   . Diabetes mellitus without complication   . Sleep apnea     cannot tolerate cpap  . Arthritis     oa    Assessment/Plan: 1 Day Post-Op Procedure(s) (LRB): RIGHT TOTAL KNEE ARTHROPLASTY (Right) Principal Problem:   OA (osteoarthritis) of knee Active Problems:  Postoperative anemia due to acute blood loss  Estimated body mass index is 30.45 kg/(m^2) as calculated from the following:   Height as of this encounter: 5\' 8"  (1.727 m).   Weight as of this encounter: 90.81 kg (200 lb 3.2 oz). Advance diet Up with therapy Discharge to SNF  DVT Prophylaxis - Lovenox for 10 days and then will switch over to a full dose Aspirin 325 mg daily. Weight-Bearing as tolerated to right leg D/C O2 and Pulse OX and try on Room Air  PERKINS, ALEXZANDREW 03/18/2014, 8:15 AM

## 2014-03-19 LAB — CBC
HEMATOCRIT: 32.5 % — AB (ref 39.0–52.0)
Hemoglobin: 11 g/dL — ABNORMAL LOW (ref 13.0–17.0)
MCH: 30.1 pg (ref 26.0–34.0)
MCHC: 33.8 g/dL (ref 30.0–36.0)
MCV: 88.8 fL (ref 78.0–100.0)
Platelets: 133 10*3/uL — ABNORMAL LOW (ref 150–400)
RBC: 3.66 MIL/uL — ABNORMAL LOW (ref 4.22–5.81)
RDW: 13.1 % (ref 11.5–15.5)
WBC: 10.4 10*3/uL (ref 4.0–10.5)

## 2014-03-19 LAB — BASIC METABOLIC PANEL
BUN: 16 mg/dL (ref 6–23)
CHLORIDE: 98 meq/L (ref 96–112)
CO2: 27 mEq/L (ref 19–32)
Calcium: 8.8 mg/dL (ref 8.4–10.5)
Creatinine, Ser: 0.95 mg/dL (ref 0.50–1.35)
GFR calc non Af Amer: 79 mL/min — ABNORMAL LOW (ref >90)
Glucose, Bld: 271 mg/dL — ABNORMAL HIGH (ref 70–99)
POTASSIUM: 4.5 meq/L (ref 3.7–5.3)
Sodium: 136 mEq/L — ABNORMAL LOW (ref 137–147)

## 2014-03-19 LAB — GLUCOSE, CAPILLARY
GLUCOSE-CAPILLARY: 250 mg/dL — AB (ref 70–99)
Glucose-Capillary: 195 mg/dL — ABNORMAL HIGH (ref 70–99)
Glucose-Capillary: 212 mg/dL — ABNORMAL HIGH (ref 70–99)
Glucose-Capillary: 166 mg/dL — ABNORMAL HIGH (ref 70–99)

## 2014-03-19 MED ORDER — ACETAMINOPHEN 325 MG PO TABS: 650.0000 mg | ORAL_TABLET | Freq: Four times a day (QID) | ORAL | Status: AC | PRN

## 2014-03-19 MED ORDER — METHOCARBAMOL 500 MG PO TABS
500.0000 mg | ORAL_TABLET | Freq: Four times a day (QID) | ORAL | Status: AC | PRN
Start: 2014-03-19 — End: ?

## 2014-03-19 MED ORDER — DSS 100 MG PO CAPS: 100.0000 mg | ORAL_CAPSULE | Freq: Two times a day (BID) | ORAL | Status: AC

## 2014-03-19 MED ORDER — MORPHINE SULFATE ER 20 MG PO CP24: 20.0000 mg | ORAL_CAPSULE | Freq: Every evening | ORAL | Status: AC

## 2014-03-19 MED ORDER — ENOXAPARIN SODIUM 30 MG/0.3ML ~~LOC~~ SOLN: 30.0000 mg | Freq: Two times a day (BID) | SUBCUTANEOUS | Status: AC

## 2014-03-19 MED ORDER — OXYCODONE HCL 5 MG PO TABS: 5.0000 mg | ORAL_TABLET | ORAL | Status: AC | PRN

## 2014-03-19 MED ORDER — METOCLOPRAMIDE HCL 5 MG PO TABS: 5.0000 mg | ORAL_TABLET | Freq: Three times a day (TID) | ORAL | Status: AC | PRN

## 2014-03-19 MED ORDER — ALUM & MAG HYDROXIDE-SIMETH 200-200-20 MG/5ML PO SUSP: 30.0000 mL | ORAL | Status: AC | PRN

## 2014-03-19 MED ORDER — ONDANSETRON HCL 4 MG PO TABS: 4.0000 mg | ORAL_TABLET | Freq: Four times a day (QID) | ORAL | Status: AC | PRN

## 2014-03-19 MED ORDER — BISACODYL 10 MG RE SUPP: 10.0000 mg | Freq: Every day | RECTAL | Status: AC | PRN

## 2014-03-19 NOTE — Progress Notes (Signed)
   Subjective: 2 Days Post-Op Procedure(s) (LRB): RIGHT TOTAL KNEE ARTHROPLASTY (Right) Patient reports pain as mild.   Patient seen in rounds for Dr. Lequita HaltAluisio on moring rounds. Patient is well, and has had no acute complaints or problems Plan is to go Skilled nursing facility after hospital stay.  Objective: Vital signs in last 24 hours: Temp:  [97.4 F (36.3 C)-98.8 F (37.1 C)] 97.9 F (36.6 C) (03/25 1430) Pulse Rate:  [58-65] 58 (03/25 1430) Resp:  [16-17] 16 (03/25 1600) BP: (130-161)/(52-72) 161/56 mmHg (03/25 1430) SpO2:  [96 %-99 %] 99 % (03/25 1430)  Intake/Output from previous day:  Intake/Output Summary (Last 24 hours) at 03/19/14 2211 Last data filed at 03/19/14 1911  Gross per 24 hour  Intake    870 ml  Output   2605 ml  Net  -1735 ml    Intake/Output this shift: Total I/O In: 120 [P.O.:120] Out: 325 [Urine:325]  Labs:  Recent Labs  03/18/14 0411 03/19/14 0359  HGB 11.6* 11.0*    Recent Labs  03/18/14 0411 03/19/14 0359  WBC 9.4 10.4  RBC 3.83* 3.66*  HCT 33.5* 32.5*  PLT 157 133*    Recent Labs  03/18/14 0411 03/19/14 0359  NA 137 136*  K 3.8 4.5  CL 100 98  CO2 28 27  BUN 11 16  CREATININE 0.88 0.95  GLUCOSE 233* 271*  CALCIUM 8.5 8.8   No results found for this basename: LABPT, INR,  in the last 72 hours  EXAM General - Patient is Alert, Appropriate and Oriented Extremity - Neurovascular intact Sensation intact distally Dorsiflexion/Plantar flexion intact Dressing/Incision - clean, dry, no drainage, healing Motor Function - intact, moving foot and toes well on exam.   Past Medical History  Diagnosis Date  . Hypertension   . Diabetes mellitus without complication   . Sleep apnea     cannot tolerate cpap  . Arthritis     oa    Assessment/Plan: 2 Days Post-Op Procedure(s) (LRB): RIGHT TOTAL KNEE ARTHROPLASTY (Right) Principal Problem:   OA (osteoarthritis) of knee Active Problems:   Postoperative anemia due to  acute blood loss  Estimated body mass index is 30.45 kg/(m^2) as calculated from the following:   Height as of this encounter: 5\' 8"  (1.727 m).   Weight as of this encounter: 90.81 kg (200 lb 3.2 oz). Up with therapy Plan for discharge tomorrow Discharge to SNF - Awaiting final bed offer and approval.  DVT Prophylaxis - Lovenox for 10 days and then will switch over to a full dose Aspirin 325 mg daily. Weight-Bearing as tolerated to right leg  PERKINS, ALEXZANDREW 03/19/2014, 10:11 PM

## 2014-03-19 NOTE — Progress Notes (Signed)
CSW assisting with d/c planning. [facility] does not have an opening at this time. [facility] SNF bed will be available THURS if pt is stable for d/c.  Cori RazorJamie Tamarra Geiselman LCSW [phone]

## 2014-03-19 NOTE — Progress Notes (Signed)
Pt's CBG randomly checked at 2235 and was 314; as per Nurse Maralyn SagoSarah during day shift, PA Kenard GowerDrew was notified of his elevated blood sugar but gave no further order at the moment; but will probably be addressed in the morning during rounds.

## 2014-03-19 NOTE — Progress Notes (Signed)
Physical Therapy Treatment Patient Details Name: Joshua BillJohn W Rich MRN: [id number] DOB: 07/11/1935 Today's Date: 03/19/2014    History of Present Illness RTKA    PT Comments    POD # 2 am session.  Applied KI and instructed pt on it's use for amb.  Assisted pt OOB to amb limited distance in hallway.  Then to recliner to perform TKR TE's.  ICE.  Follow Up Recommendations  SNF     Equipment Recommendations  None recommended by PT    Recommendations for Other Services       Precautions / Restrictions Precautions Precautions: Fall;Knee Required Braces or Orthoses: Knee Immobilizer - Right Knee Immobilizer - Right: Discontinue once straight leg raise with < 10 degree lag Restrictions Weight Bearing Restrictions: No    Mobility  Bed Mobility Overal bed mobility: Needs Assistance Bed Mobility: Supine to Sit     Supine to sit: Min guard;Min assist     General bed mobility comments: assist to support R LE  Transfers Overall transfer level: Needs assistance Equipment used: Rolling walker (2 wheeled) Transfers: Sit to/from Stand Sit to Stand: Min assist;From elevated surface;+2 safety/equipment         General transfer comment: cues for sequence and safety for standing at RW.  Ambulation/Gait Ambulation/Gait assistance: Min assist;Mod assist Ambulation Distance (Feet): 42 Feet Assistive device: Rolling walker (2 wheeled) Gait Pattern/deviations: Step-to pattern;Decreased stance time - right;Trunk flexed Gait velocity: decreased   General Gait Details: cues for sequence and posture with proper walker to self distance.   Stairs            Wheelchair Mobility    Modified Rankin (Stroke Patients Only)       Balance                                    Cognition                            Exercises   Total Knee Replacement TE's 10 reps B LE ankle pumps 10 reps towel squeezes 10 reps knee presses 10 reps heel slides  10 reps  SAQ's 10 reps SLR's 10 reps ABD Followed by ICE    General Comments        Pertinent Vitals/Pain     Home Living                      Prior Function            PT Goals (current goals can now be found in the care plan section) Progress towards PT goals: Progressing toward goals    Frequency  7X/week    PT Plan      End of Session Equipment Utilized During Treatment: Gait belt;Right knee immobilizer Activity Tolerance: Patient limited by fatigue;Patient limited by pain Patient left: in chair;with call bell/phone within reach     Time: 1137-1205 PT Time Calculation (min): 28 min  Charges:  $Gait Training: 8-22 mins $Therapeutic Exercise: 8-22 mins                    G Codes:      Felecia ShellingLori Aniket Paye  PTA [facility]  Acute  Rehab Pager      [phone]

## 2014-03-19 NOTE — Progress Notes (Signed)
Physical Therapy Treatment Patient Details Name: Joshua BillJohn W Rich MRN: [id number] DOB: 11/30/1935 Today's Date: 03/19/2014    History of Present Illness RTKA    PT Comments    POD # 2 pm session.  Assisted to BR to void then amb limited distance in hallway.  Pt c/o increased pain.  Assisted back to bed for CPM.  Follow Up Recommendations  SNF  (hopefully [facility])   Equipment Recommendations  None recommended by PT    Recommendations for Other Services       Precautions / Restrictions Precautions Precautions: Fall;Knee Required Braces or Orthoses: Knee Immobilizer - Right Knee Immobilizer - Right: Discontinue once straight leg raise with < 10 degree lag Restrictions Weight Bearing Restrictions: No    Mobility  Bed Mobility Overal bed mobility: Needs Assistance Bed Mobility: Sit to Supine     Supine to sit: Min assist     General bed mobility comments: min assist back to bed  Transfers Overall transfer level: Needs assistance Equipment used: Rolling walker (2 wheeled) Transfers: Sit to/from Stand Sit to Stand: Min assist;From elevated surface;+2 safety/equipment         General transfer comment: cues for sequence and safety for standing at RW.  Ambulation/Gait Ambulation/Gait assistance: Min assist;Mod assist Ambulation Distance (Feet): 46 Feet Assistive device: Rolling walker (2 wheeled) Gait Pattern/deviations: Step-to pattern;Decreased stance time - right Gait velocity: decreased   General Gait Details: cues for sequence and posture with proper walker to self distance.   Stairs            Wheelchair Mobility    Modified Rankin (Stroke Patients Only)       Balance                                    Cognition                            Exercises      General Comments        Pertinent Vitals/Pain     Home Living                      Prior Function            PT Goals (current goals  can now be found in the care plan section) Progress towards PT goals: Progressing toward goals    Frequency  7X/week    PT Plan      End of Session Equipment Utilized During Treatment: Gait belt Activity Tolerance: Patient limited by fatigue;Patient limited by pain Patient left: in bed;with call bell/phone within reach     Time: 1405-1430 PT Time Calculation (min): 25 min  Charges:  $Gait Training: 8-22 mins $Therapeutic Exercise: 8-22 mins $Therapeutic Activity: 8-22 mins                    G Codes:      Felecia ShellingLori Elison Worrel  PTA [facility]  Acute  Rehab Pager      [phone]

## 2014-03-19 NOTE — Discharge Summary (Signed)
Physician Discharge Summary   Patient ID: Joshua Rich MRN: [id number] DOB/AGE: Dec 07, 1935 79 y.o.  Admit date: 03/17/2014 Discharge date: 03-20-2014  Primary Diagnosis:  Osteoarthritis Right knee(s)  Admission Diagnoses:  Past Medical History  Diagnosis Date  . Hypertension   . Diabetes mellitus without complication   . Sleep apnea     cannot tolerate cpap  . Arthritis     oa   Discharge Diagnoses:   Principal Problem:   OA (osteoarthritis) of knee Active Problems:   Postoperative anemia due to acute blood loss  Estimated body mass index is 30.45 kg/(m^2) as calculated from the following:   Height as of this encounter: $RemoveBeforeD'5\' 8"'[id number]$  (1.727 m).   Weight as of this encounter: 90.81 kg (200 lb 3.2 oz).  Procedure:  Procedure(s) (LRB): RIGHT TOTAL KNEE ARTHROPLASTY (Right)   Consults: None  HPI: Joshua Rich is a 79 y.o. year old male with end stage OA of his right knee with progressively worsening pain and dysfunction. He has constant pain, with activity and at rest and significant functional deficits with difficulties even with ADLs. He has had extensive non-op management including analgesics, injections of cortisone, and home exercise program, but remains in significant pain with significant dysfunction. Radiographs show bone on bone arthritis medial and patellofemoral with large medial osteophytes. He presents now for right Total Knee Arthroplasty.   Laboratory Data: Admission on 03/17/2014  Component Date Value Ref Range Status  . ABO/RH(D) 03/17/2014 A POS   Final  . Antibody Screen 03/17/2014 NEG   Final  . Sample Expiration 03/17/2014 03/20/2014   Final  . Glucose-Capillary 03/17/2014 124* 70 - 99 mg/dL Final  . WBC 03/18/2014 9.4  4.0 - 10.5 K/uL Final  . RBC 03/18/2014 3.83* 4.22 - 5.81 MIL/uL Final  . Hemoglobin 03/18/2014 11.6* 13.0 - 17.0 g/dL Final  . HCT 03/18/2014 33.5* 39.0 - 52.0 % Final  . MCV 03/18/2014 87.5  78.0 - 100.0 fL Final  . MCH 03/18/2014 30.3  26.0 -  34.0 pg Final  . MCHC 03/18/2014 34.6  30.0 - 36.0 g/dL Final  . RDW 03/18/2014 12.9  11.5 - 15.5 % Final  . Platelets 03/18/2014 157  150 - 400 K/uL Final  . Sodium 03/18/2014 137  137 - 147 mEq/L Final  . Potassium 03/18/2014 3.8  3.7 - 5.3 mEq/L Final  . Chloride 03/18/2014 100  96 - 112 mEq/L Final  . CO2 03/18/2014 28  19 - 32 mEq/L Final  . Glucose, Bld 03/18/2014 233* 70 - 99 mg/dL Final  . BUN 03/18/2014 11  6 - 23 mg/dL Final  . Creatinine, Ser 03/18/2014 0.88  0.50 - 1.35 mg/dL Final  . Calcium 03/18/2014 8.5  8.4 - 10.5 mg/dL Final  . GFR calc non Af Amer 03/18/2014 81* >90 mL/min Final  . GFR calc Af Amer 03/18/2014 >90  >90 mL/min Final   Comment: (NOTE)                          The eGFR has been calculated using the CKD EPI equation.                          This calculation has not been validated in all clinical situations.                          eGFR's persistently <90 mL/min signify possible  Chronic Kidney                          Disease.  . Glucose-Capillary 03/17/2014 268* 70 - 99 mg/dL Final  . Comment 1 03/17/2014 Notify RN   Final  . Comment 2 03/17/2014 Documented in Chart   Final  . Glucose-Capillary 03/17/2014 253* 70 - 99 mg/dL Final  . Glucose-Capillary 03/18/2014 218* 70 - 99 mg/dL Final  . Comment 1 03/18/2014 Notify RN   Final  . Comment 2 03/18/2014 Documented in Chart   Final  . Glucose-Capillary 03/18/2014 190* 70 - 99 mg/dL Final  . Comment 1 03/18/2014 Notify RN   Final  . Comment 2 03/18/2014 Documented in Chart   Final  . WBC 03/19/2014 10.4  4.0 - 10.5 K/uL Final  . RBC 03/19/2014 3.66* 4.22 - 5.81 MIL/uL Final  . Hemoglobin 03/19/2014 11.0* 13.0 - 17.0 g/dL Final  . HCT 03/19/2014 32.5* 39.0 - 52.0 % Final  . MCV 03/19/2014 88.8  78.0 - 100.0 fL Final  . MCH 03/19/2014 30.1  26.0 - 34.0 pg Final  . MCHC 03/19/2014 33.8  30.0 - 36.0 g/dL Final  . RDW 03/19/2014 13.1  11.5 - 15.5 % Final  . Platelets 03/19/2014 133* 150 - 400 K/uL Final  .  Sodium 03/19/2014 136* 137 - 147 mEq/L Final  . Potassium 03/19/2014 4.5  3.7 - 5.3 mEq/L Final  . Chloride 03/19/2014 98  96 - 112 mEq/L Final  . CO2 03/19/2014 27  19 - 32 mEq/L Final  . Glucose, Bld 03/19/2014 271* 70 - 99 mg/dL Final  . BUN 03/19/2014 16  6 - 23 mg/dL Final  . Creatinine, Ser 03/19/2014 0.95  0.50 - 1.35 mg/dL Final  . Calcium 03/19/2014 8.8  8.4 - 10.5 mg/dL Final  . GFR calc non Af Amer 03/19/2014 79* >90 mL/min Final  . GFR calc Af Amer 03/19/2014 >90  >90 mL/min Final   Comment: (NOTE)                          The eGFR has been calculated using the CKD EPI equation.                          This calculation has not been validated in all clinical situations.                          eGFR's persistently <90 mL/min signify possible Chronic Kidney                          Disease.  . Glucose-Capillary 03/18/2014 293* 70 - 99 mg/dL Final  . Glucose-Capillary 03/18/2014 314* 70 - 99 mg/dL Final  . Comment 1 03/18/2014 Notify RN   Final  . Glucose-Capillary 03/19/2014 195* 70 - 99 mg/dL Final  . Glucose-Capillary 03/19/2014 212* 70 - 99 mg/dL Final  . Glucose-Capillary 03/19/2014 250* 70 - 99 mg/dL Final  Hospital Outpatient Visit on 03/11/2014  Component Date Value Ref Range Status  . aPTT 03/11/2014 27  24 - 37 seconds Final  . WBC 03/11/2014 8.3  4.0 - 10.5 K/uL Final  . RBC 03/11/2014 4.75  4.22 - 5.81 MIL/uL Final  . Hemoglobin 03/11/2014 14.4  13.0 - 17.0 g/dL Final  . HCT 03/11/2014 41.5  39.0 - 52.0 % Final  .  MCV 03/11/2014 87.4  78.0 - 100.0 fL Final  . MCH 03/11/2014 30.3  26.0 - 34.0 pg Final  . MCHC 03/11/2014 34.7  30.0 - 36.0 g/dL Final  . RDW 03/11/2014 13.1  11.5 - 15.5 % Final  . Platelets 03/11/2014 208  150 - 400 K/uL Final  . Sodium 03/11/2014 141  137 - 147 mEq/L Final  . Potassium 03/11/2014 3.4* 3.7 - 5.3 mEq/L Final  . Chloride 03/11/2014 100  96 - 112 mEq/L Final  . CO2 03/11/2014 30  19 - 32 mEq/L Final  . Glucose, Bld 03/11/2014 142*  70 - 99 mg/dL Final  . BUN 03/11/2014 12  6 - 23 mg/dL Final  . Creatinine, Ser 03/11/2014 1.06  0.50 - 1.35 mg/dL Final  . Calcium 03/11/2014 9.4  8.4 - 10.5 mg/dL Final  . Total Protein 03/11/2014 7.3  6.0 - 8.3 g/dL Final  . Albumin 03/11/2014 4.1  3.5 - 5.2 g/dL Final  . AST 03/11/2014 18  0 - 37 U/L Final  . ALT 03/11/2014 15  0 - 53 U/L Final  . Alkaline Phosphatase 03/11/2014 94  39 - 117 U/L Final  . Total Bilirubin 03/11/2014 0.5  0.3 - 1.2 mg/dL Final  . GFR calc non Af Amer 03/11/2014 66* >90 mL/min Final  . GFR calc Af Amer 03/11/2014 77* >90 mL/min Final   Comment: (NOTE)                          The eGFR has been calculated using the CKD EPI equation.                          This calculation has not been validated in all clinical situations.                          eGFR's persistently <90 mL/min signify possible Chronic Kidney                          Disease.  Marland Kitchen Prothrombin Time 03/11/2014 13.2  11.6 - 15.2 seconds Final  . INR 03/11/2014 1.02  0.00 - 1.49 Final  . Color, Urine 03/11/2014 YELLOW  YELLOW Final  . APPearance 03/11/2014 CLEAR  CLEAR Final  . Specific Gravity, Urine 03/11/2014 1.011  1.005 - 1.030 Final  . pH 03/11/2014 5.0  5.0 - 8.0 Final  . Glucose, UA 03/11/2014 NEGATIVE  NEGATIVE mg/dL Final  . Hgb urine dipstick 03/11/2014 NEGATIVE  NEGATIVE Final  . Bilirubin Urine 03/11/2014 NEGATIVE  NEGATIVE Final  . Ketones, ur 03/11/2014 NEGATIVE  NEGATIVE mg/dL Final  . Protein, ur 03/11/2014 NEGATIVE  NEGATIVE mg/dL Final  . Urobilinogen, UA 03/11/2014 0.2  0.0 - 1.0 mg/dL Final  . Nitrite 03/11/2014 NEGATIVE  NEGATIVE Final  . Leukocytes, UA 03/11/2014 NEGATIVE  NEGATIVE Final   MICROSCOPIC NOT DONE ON URINES WITH NEGATIVE PROTEIN, BLOOD, LEUKOCYTES, NITRITE, OR GLUCOSE <1000 mg/dL.  Marland Kitchen MRSA, PCR 03/11/2014 NEGATIVE  NEGATIVE Final  . Staphylococcus aureus 03/11/2014 POSITIVE* NEGATIVE Final   Comment:                                 The Xpert SA Assay  (FDA  approved for NASAL specimens                          in patients over 97 years of age),                          is one component of                          a comprehensive surveillance                          program.  Test performance has                          been validated by [employer] for patients greater                          than or equal to 3 year old.                          It is not intended                          to diagnose infection nor to                          guide or monitor treatment.     X-Rays:Dg Chest 2 View  03/11/2014   CLINICAL DATA:  Preop for knee replacement  EXAM: CHEST  2 VIEW  COMPARISON:  02/02/2012  FINDINGS: Cardiomediastinal silhouette is stable. No acute infiltrate or pleural effusion. No pulmonary edema. Metallic fixation rods are noted thoracic spine.  IMPRESSION: No active cardiopulmonary disease.   Electronically Signed   By: Lahoma Crocker M.D.   On: 03/11/2014 12:34   Dg Hip Complete Right  03/11/2014   CLINICAL DATA:  Right knee replacement.  EXAM: RIGHT HIP - COMPLETE 2+ VIEW  COMPARISON:  No priors.  FINDINGS: AP view of the pelvis demonstrates no acute displaced fracture of the bony pelvic ring. Bilateral proximal femurs as visualized are intact. Right femoral head is located. Mild joint space narrowing, subchondral sclerosis, subchondral cyst formation and osteophyte formation in the hip joints bilaterally, indicative of osteoarthritis. Orthopedic fixation hardware in the lower lumbar spine incompletely visualized. Numerous surgical clips project over the low anatomic pelvis.  IMPRESSION: 1. No acute radiographic abnormality of the bony pelvis or the right hip. 2. Mild bilateral hip joint osteoarthritis.   Electronically Signed   By: Vinnie Langton M.D.   On: 03/11/2014 13:25    EKG: Orders placed during the hospital encounter of 03/11/14  . EKG 12-LEAD  . EKG 12-LEAD      Hospital Course: Joshua Rich is a 79 y.o. who was admitted to [facility]. They were brought to the operating room on 03/17/2014 and underwent Procedure(s): RIGHT TOTAL KNEE ARTHROPLASTY.  Patient tolerated the procedure well and was later transferred to the recovery room and then to the orthopaedic floor for postoperative care.  They were given PO and IV analgesics for pain control following  their surgery.  They were given 24 hours of postoperative antibiotics of  Anti-infectives   Start     Dose/Rate Route Frequency Ordered Stop   03/17/14 1800  ceFAZolin (ANCEF) IVPB 2 g/50 mL premix     2 g 100 mL/hr over 30 Minutes Intravenous Every 6 hours 03/17/14 1428 03/18/14 0122   03/17/14 0723  ceFAZolin (ANCEF) IVPB 2 g/50 mL premix     2 g 100 mL/hr over 30 Minutes Intravenous On call to O.R. 03/17/14 9741 03/17/14 1100     and started on DVT prophylaxis in the form of Lovenox.   PT and OT were ordered for total joint protocol.  Discharge planning consulted to help with postop disposition and equipment needs.  Social worker got involved postop to assist with placement of the patient,  Patient had a decent night on the evening of surgery but just not much sleep.  They started to get up OOB with therapy on day one. Hemovac drain was pulled without difficulty.  Continued to work with therapy into day two.  Dressing was changed on day two and the incision was healing well.  By day three, the patient had progressed with therapy and meeting their goals.  Incision was healing well.  Patient was seen in rounds and was ready to go to the SNF, [facility].   Discharge Medications: Prior to Admission medications   Medication Sig Start Date End Date Taking? Authorizing Provider  carvedilol (COREG) 25 MG tablet Take 25 mg by mouth 2 (two) times daily with a meal.   Yes Historical Provider, MD  diltiazem (CARDIZEM CD) 180 MG 24 hr capsule Take 180 mg by mouth every morning.   Yes Historical Provider,  MD  diphenhydramine-acetaminophen (TYLENOL PM) 25-500 MG TABS Take 1 tablet by mouth at bedtime as needed (sleep/pain).   Yes Historical Provider, MD  furosemide (LASIX) 40 MG tablet Take 40 mg by mouth every morning.   Yes Historical Provider, MD  glimepiride (AMARYL) 1 MG tablet Take 1 mg by mouth daily with breakfast.   Yes Historical Provider, MD  irbesartan (AVAPRO) 300 MG tablet Take 300 mg by mouth every morning.   Yes Historical Provider, MD  polyethylene glycol (MIRALAX) packet Take 17 g by mouth as needed for mild constipation.   Yes Historical Provider, MD  pravastatin (PRAVACHOL) 20 MG tablet Take 20 mg by mouth every evening.   Yes Historical Provider, MD  sertraline (ZOLOFT) 25 MG tablet Take 25 mg by mouth every morning.   Yes Historical Provider, MD  acetaminophen (TYLENOL) 325 MG tablet Take 2 tablets (650 mg total) by mouth every 6 (six) hours as needed for mild pain (or Fever >/= 101). 03/19/14   Lya Holben, PA-C  alum & mag hydroxide-simeth (MAALOX/MYLANTA) 200-200-20 MG/5ML suspension Take 30 mLs by mouth every 4 (four) hours as needed for indigestion or heartburn. 03/19/14   Taven Strite Julien Girt, PA-C  bisacodyl (DULCOLAX) 10 MG suppository Place 1 suppository (10 mg total) rectally daily as needed for moderate constipation. 03/19/14   Britania Shreeve, PA-C  docusate sodium 100 MG CAPS Take 100 mg by mouth 2 (two) times daily. 03/19/14   Gerson Fauth, PA-C  enoxaparin (LOVENOX) 30 MG/0.3ML injection Inject 0.3 mLs (30 mg total) into the skin 2 (two) times daily. Continue Lovenox injections for one more week and then discontinue.  Once completed switch over to a full dose 325 mg Aspirin daily for four more weeks. 03/19/14   Minahil Quinlivan Julien Girt, PA-C  methocarbamol (ROBAXIN)  500 MG tablet Take 1 tablet (500 mg total) by mouth every 6 (six) hours as needed for muscle spasms. 03/19/14   Orvis Stann Dara Lords, PA-C  metoCLOPramide (REGLAN) 5 MG tablet Take 1-2 tablets (5-10  mg total) by mouth every 8 (eight) hours as needed for nausea (if ondansetron (ZOFRAN) ineffective.). 03/19/14   Berkley Cronkright Dara Lords, PA-C  morphine (KADIAN) 20 MG 24 hr capsule Take 1 capsule (20 mg total) by mouth every evening. 03/19/14   Cordae Mccarey Dara Lords, PA-C  ondansetron (ZOFRAN) 4 MG tablet Take 1 tablet (4 mg total) by mouth every 6 (six) hours as needed for nausea. 03/19/14   Parthiv Mucci, PA-C  oxyCODONE (OXY IR/ROXICODONE) 5 MG immediate release tablet Take 1-2 tablets (5-10 mg total) by mouth every 3 (three) hours as needed for moderate pain, severe pain or breakthrough pain. 03/19/14   Gabriel Paulding Dara Lords, PA-C    Diet: Cardiac diet and Diabetic diet Activity:WBAT Follow-up:in 2 weeks Disposition - [place] Discharged Condition: good   Discharge Orders   Future Orders Complete By Expires   Call MD / Call 911  As directed    Comments:     If you experience chest pain or shortness of breath, CALL 911 and be transported to the hospital emergency room.  If you develope a fever above 101 F, pus (white drainage) or increased drainage or redness at the wound, or calf pain, call your surgeon's office.   Change dressing  As directed    Comments:     Change dressing daily with sterile 4 x 4 inch gauze dressing and apply TED hose. Do not submerge the incision under water.   Constipation Prevention  As directed    Comments:     Drink plenty of fluids.  Prune juice may be helpful.  You may use a stool softener, such as Colace (over the counter) 100 mg twice a day.  Use MiraLax (over the counter) for constipation as needed.   Diet general  As directed    Discharge instructions  As directed    Comments:     Pick up stool softner and laxative for home. Do not submerge incision under water. May shower. Continue to use ice for pain and swelling from surgery. Lovenox for 10 days and then will switch over to a full dose Aspirin 325 mg daily for four more  weeks.   Do not put a pillow under the knee. Place it under the heel.  As directed    Do not sit on low chairs, stoools or toilet seats, as it may be difficult to get up from low surfaces  As directed    Driving restrictions  As directed    Comments:     No driving until released by the physician.   Increase activity slowly as tolerated  As directed    Lifting restrictions  As directed    Comments:     No lifting until released by the physician.   Patient may shower  As directed    Comments:     You may shower without a dressing once there is no drainage.  Do not wash over the wound.  If drainage remains, do not shower until drainage stops.   TED hose  As directed    Comments:     Use stockings (TED hose) for 3 weeks on both leg(s).  You may remove them at night for sleeping.   Weight bearing as tolerated  As directed    Questions:  Laterality:     Extremity:         Medication List         acetaminophen 325 MG tablet  Commonly known as:  TYLENOL  Take 2 tablets (650 mg total) by mouth every 6 (six) hours as needed for mild pain (or Fever >/= 101).     alum & mag hydroxide-simeth 200-200-20 MG/5ML suspension  Commonly known as:  MAALOX/MYLANTA  Take 30 mLs by mouth every 4 (four) hours as needed for indigestion or heartburn.     bisacodyl 10 MG suppository  Commonly known as:  DULCOLAX  Place 1 suppository (10 mg total) rectally daily as needed for moderate constipation.     carvedilol 25 MG tablet  Commonly known as:  COREG  Take 25 mg by mouth 2 (two) times daily with a meal.     diltiazem 180 MG 24 hr capsule  Commonly known as:  CARDIZEM CD  Take 180 mg by mouth every morning.     diphenhydramine-acetaminophen 25-500 MG Tabs  Commonly known as:  TYLENOL PM  Take 1 tablet by mouth at bedtime as needed (sleep/pain).     DSS 100 MG Caps  Take 100 mg by mouth 2 (two) times daily.     enoxaparin 30 MG/0.3ML injection  Commonly known as:  LOVENOX  Inject 0.3  mLs (30 mg total) into the skin 2 (two) times daily. Continue Lovenox injections for one more week and then discontinue.  Once completed switch over to a full dose 325 mg Aspirin daily for four more weeks.     furosemide 40 MG tablet  Commonly known as:  LASIX  Take 40 mg by mouth every morning.     glimepiride 1 MG tablet  Commonly known as:  AMARYL  Take 1 mg by mouth daily with breakfast.     irbesartan 300 MG tablet  Commonly known as:  AVAPRO  Take 300 mg by mouth every morning.     methocarbamol 500 MG tablet  Commonly known as:  ROBAXIN  Take 1 tablet (500 mg total) by mouth every 6 (six) hours as needed for muscle spasms.     metoCLOPramide 5 MG tablet  Commonly known as:  REGLAN  Take 1-2 tablets (5-10 mg total) by mouth every 8 (eight) hours as needed for nausea (if ondansetron (ZOFRAN) ineffective.).     MIRALAX packet  Generic drug:  polyethylene glycol  Take 17 g by mouth as needed for mild constipation.     morphine 20 MG 24 hr capsule  Commonly known as:  KADIAN  Take 1 capsule (20 mg total) by mouth every evening.     ondansetron 4 MG tablet  Commonly known as:  ZOFRAN  Take 1 tablet (4 mg total) by mouth every 6 (six) hours as needed for nausea.     oxyCODONE 5 MG immediate release tablet  Commonly known as:  Oxy IR/ROXICODONE  Take 1-2 tablets (5-10 mg total) by mouth every 3 (three) hours as needed for moderate pain, severe pain or breakthrough pain.     pravastatin 20 MG tablet  Commonly known as:  PRAVACHOL  Take 20 mg by mouth every evening.     sertraline 25 MG tablet  Commonly known as:  ZOLOFT  Take 25 mg by mouth every morning.           Follow-up Information   Follow up with Gearlean Alf, MD. Schedule an appointment as soon as possible for a visit on 04/01/2014.  Specialty:  Orthopedic Surgery   Contact information:   [address] [place] [address] [phone]       Signed: Mickel Crow 03/19/2014, 10:25 PM

## 2014-03-20 LAB — CBC
HCT: 30.4 % — ABNORMAL LOW (ref 39.0–52.0)
Hemoglobin: 10.2 g/dL — ABNORMAL LOW (ref 13.0–17.0)
MCH: 29.8 pg (ref 26.0–34.0)
MCHC: 33.6 g/dL (ref 30.0–36.0)
MCV: 88.9 fL (ref 78.0–100.0)
PLATELETS: 127 10*3/uL — AB (ref 150–400)
RBC: 3.42 MIL/uL — ABNORMAL LOW (ref 4.22–5.81)
RDW: 13.3 % (ref 11.5–15.5)
WBC: 10.8 10*3/uL — AB (ref 4.0–10.5)

## 2014-03-20 LAB — GLUCOSE, CAPILLARY: GLUCOSE-CAPILLARY: 125 mg/dL — AB (ref 70–99)

## 2014-03-20 NOTE — Progress Notes (Signed)
Clinical Social Work Department CLINICAL SOCIAL WORK PLACEMENT NOTE 03/20/2014  Patient:  Joshua Rich,Joshua Rich  Account Number:  [id number] Admit date:  03/17/2014  Clinical Social Worker:  Joshua RazorJAMIE Braniya Farrugia, LCSW  Date/time:  03/17/2014 03:02 PM  Clinical Social Work is seeking post-discharge placement for this patient at the following level of care:   SKILLED NURSING   (*CSW will update this form in Epic as items are completed)     Patient/family provided with [facility] of Clinical Social Work's list of facilities offering this level of care within the geographic area requested by the patient (or if unable, by the patient's family).  03/17/2014  Patient/family informed of their freedom to choose among providers that offer the needed level of care, that participate in Medicare, Medicaid or managed care program needed by the patient, have an available bed and are willing to accept the patient.    Patient/family informed of [facility]' ownership interest in [facility], as well as of the fact that they are under no obligation to receive care at this facility.  PASARR submitted to EDS on 03/17/2014 PASARR number received from EDS on 03/17/2014  FL2 transmitted to all facilities in geographic area requested by pt/family on  03/17/2014 FL2 transmitted to all facilities within larger geographic area on   Patient informed that his/her managed care company has contracts with or will negotiate with  certain facilities, including the following:     Patient/family informed of bed offers received:  03[facility]19/2014 [facility] recommends and patient chooses bed at    [facility]  on  03/20/2014 Patient to be transferred to facility by FAMILY  The following physician request were entered in Epic:   Additional Comments:  Joshua RazorJamie Jaylanni [place] LCSW [phone]

## 2014-03-20 NOTE — Progress Notes (Signed)
   Subjective: 3 Days Post-Op Procedure(s) (LRB): RIGHT TOTAL KNEE ARTHROPLASTY (Right) Patient reports pain as mild.   Patient seen in rounds for Dr. Lequita HaltAluisio.  Sitting up an edge of bed.  Feeling little better today. Patient is well, but has had some minor complaints of pain in the knee, requiring pain medications Patient is ready to go the SNF.  Objective: Vital signs in last 24 hours: Temp:  [97.7 F (36.5 C)-98.2 F (36.8 C)] 98.1 F (36.7 C) (03/26 16100633) Pulse Rate:  [58-70] 70 (03/26 0633) Resp:  [16-17] 16 (03/26 0633) BP: (130-168)/(52-70) 145/68 mmHg (03/26 0633) SpO2:  [95 %-99 %] 96 % (03/26 96040633)  Intake/Output from previous day:  Intake/Output Summary (Last 24 hours) at 03/20/14 0742 Last data filed at 03/20/14 [id number]  Gross per 24 hour  Intake 994.33 ml  Output   1980 ml  Net -985.67 ml    Intake/Output this shift:    Labs:  Recent Labs  03/18/14 0411 03/19/14 0359 03/20/14 0410  HGB 11.6* 11.0* 10.2*    Recent Labs  03/19/14 0359 03/20/14 0410  WBC 10.4 10.8*  RBC 3.66* 3.42*  HCT 32.5* 30.4*  PLT 133* 127*    Recent Labs  03/18/14 0411 03/19/14 0359  NA 137 136*  K 3.8 4.5  CL 100 98  CO2 28 27  BUN 11 16  CREATININE 0.88 0.95  GLUCOSE 233* 271*  CALCIUM 8.5 8.8   No results found for this basename: LABPT, INR,  in the last 72 hours  EXAM: General - Patient is Alert, Appropriate and Oriented Extremity - Neurovascular intact Sensation intact distally Incision - clean, dry, no drainage, healing Motor Function - intact, moving foot and toes well on exam.   Assessment/Plan: 3 Days Post-Op Procedure(s) (LRB): RIGHT TOTAL KNEE ARTHROPLASTY (Right) Procedure(s) (LRB): RIGHT TOTAL KNEE ARTHROPLASTY (Right) Past Medical History  Diagnosis Date  . Hypertension   . Diabetes mellitus without complication   . Sleep apnea     cannot tolerate cpap  . Arthritis     oa   Principal Problem:   OA (osteoarthritis) of knee Active  Problems:   Postoperative anemia due to acute blood loss  Estimated body mass index is 30.45 kg/(m^2) as calculated from the following:   Height as of this encounter: 5\' 8"  (1.727 m).   Weight as of this encounter: 90.81 kg (200 lb 3.2 oz). Up with therapy Discharge to SNF Diet - Cardiac diet and Diabetic diet Follow up - in 2 weeks Activity - WBAT Disposition - Skilled nursing facility Condition Upon Discharge - Good D/C Meds - See DC Summary DVT Prophylaxis - Lovenox for 10 days and then will switch over to a full dose Aspirin 325 mg daily for four more weeks.   Joshua Rich 03/20/2014, 7:42 AM

## 2014-03-20 NOTE — Progress Notes (Addendum)
Physical Therapy Treatment Patient Details Name: Regis BillJohn W Haddaway MRN: [id number] DOB: 02/20/1935 Today's Date: 03/20/2014    History of Present Illness RTKA    PT Comments    POD # 3 assisted pt OOb to amb in hallway then performed car transfer.  Follow Up Recommendations  SNF     Equipment Recommendations  None recommended by PT    Recommendations for Other Services       Precautions / Restrictions Precautions Precautions: Fall;Knee Required Braces or Orthoses: Knee Immobilizer - Right Knee Immobilizer - Right: Discontinue once straight leg raise with < 10 degree lag Restrictions Weight Bearing Restrictions: No    Mobility  Bed Mobility Overal bed mobility: Needs Assistance Bed Mobility: Supine to Sit     Supine to sit: Min assist     General bed mobility comments: min assist R LE  Transfers Overall transfer level: Needs assistance Equipment used: Rolling walker (2 wheeled) Transfers: Sit to/from Stand Sit to Stand: Min assist;From elevated surface;+2 safety/equipment         General transfer comment: cues for sequence and safety for standing at RW.  Also performed car transfer.  Ambulation/Gait Ambulation/Gait assistance: Min assist;Mod assist Ambulation Distance (Feet): 55 Feet Assistive device: Rolling walker (2 wheeled) Gait Pattern/deviations: Step-to pattern Gait velocity: decreased   General Gait Details: cues for sequence and posture with proper walker to self distance.   Stairs            Wheelchair Mobility    Modified Rankin (Stroke Patients Only)       Balance                                    Cognition                            Exercises      General Comments        Pertinent Vitals/Pain     Home Living                      Prior Function            PT Goals (current goals can now be found in the care plan section) Progress towards PT goals: Progressing toward goals     Frequency  7X/week    PT Plan      End of Session Equipment Utilized During Treatment: Gait belt Activity Tolerance: Patient limited by fatigue;Patient limited by pain       Time: 1035-1100 PT Time Calculation (min): 25 min  Charges:  $Gait Training: 23-37 mins                    G Codes:      Felecia ShellingLori Tolbert Matheson  PTA [facility]  Acute  Rehab Pager      [phone]

## 2015-05-25 IMAGING — CR DG HIP COMPLETE 2+V*R*
3 series · 3 of 3 positions shown · non-contrast
Comparison: No priors.

CLINICAL DATA: Right knee replacement.

EXAM:
RIGHT HIP - COMPLETE 2+ VIEW

[t pelvis a.p.]
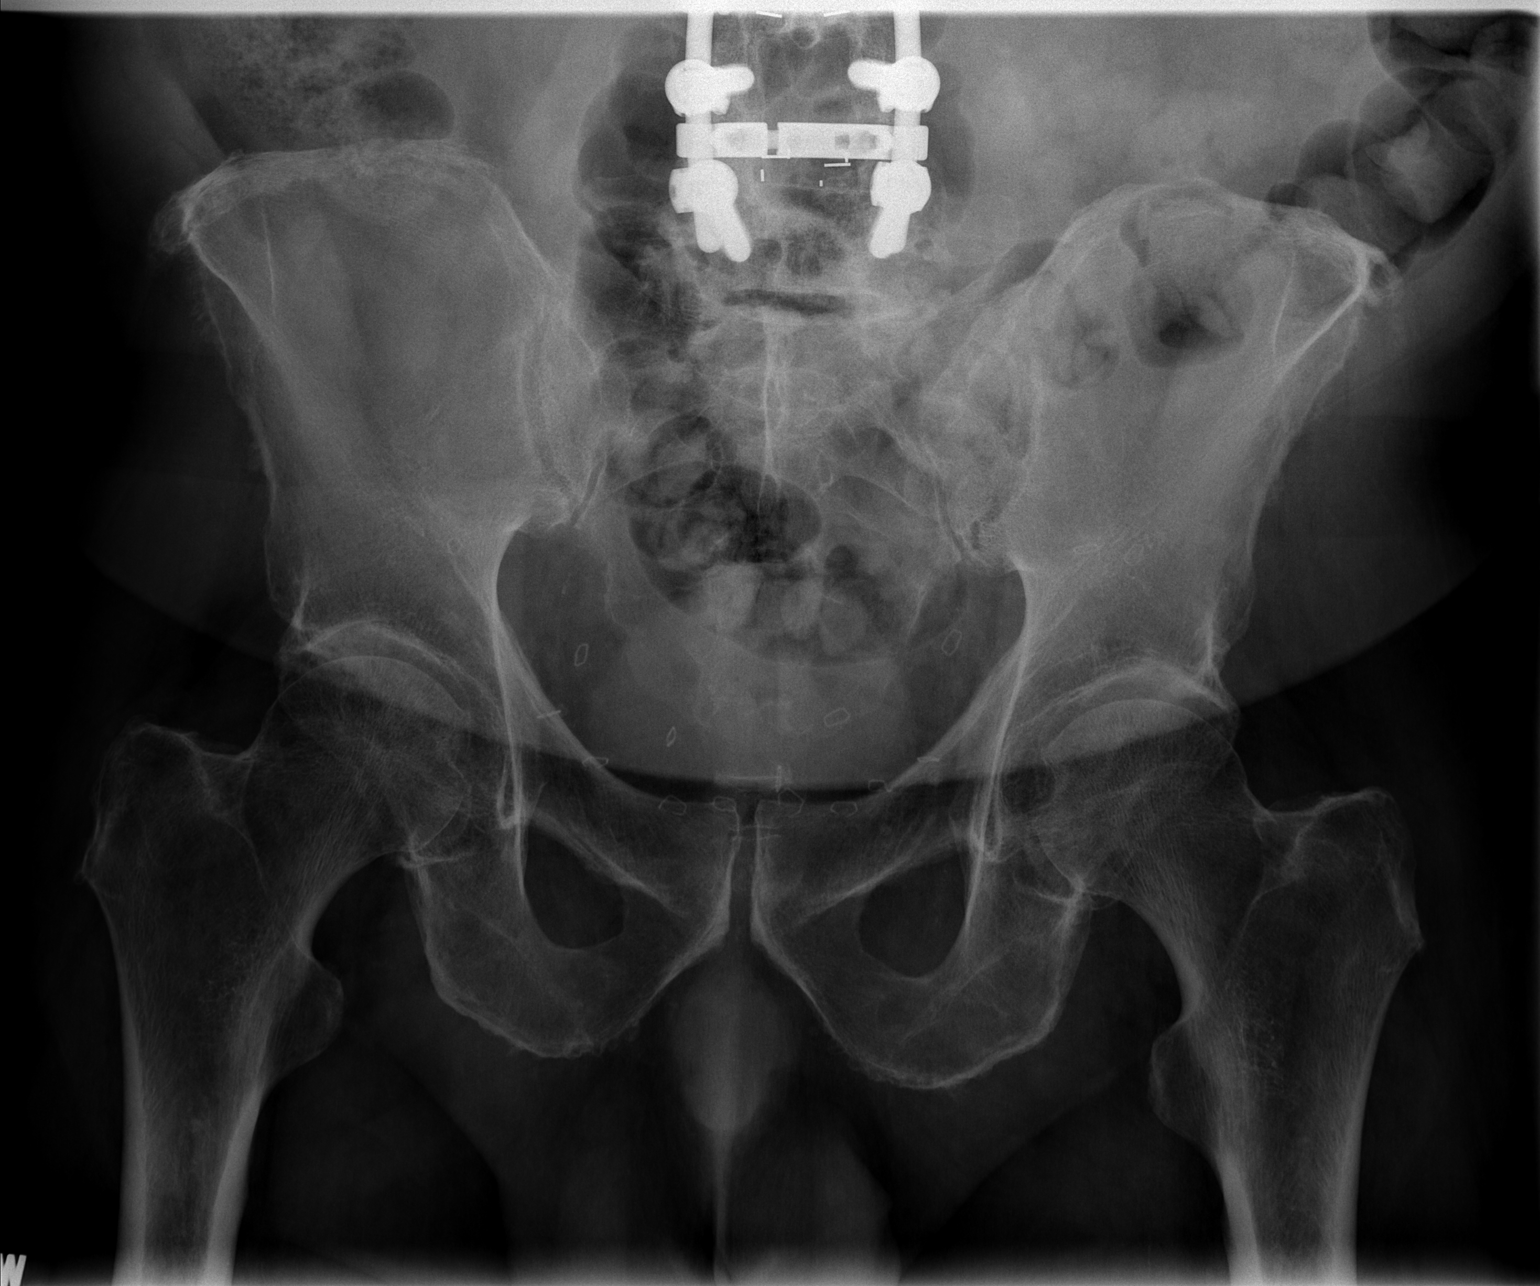

[t hip ap right]
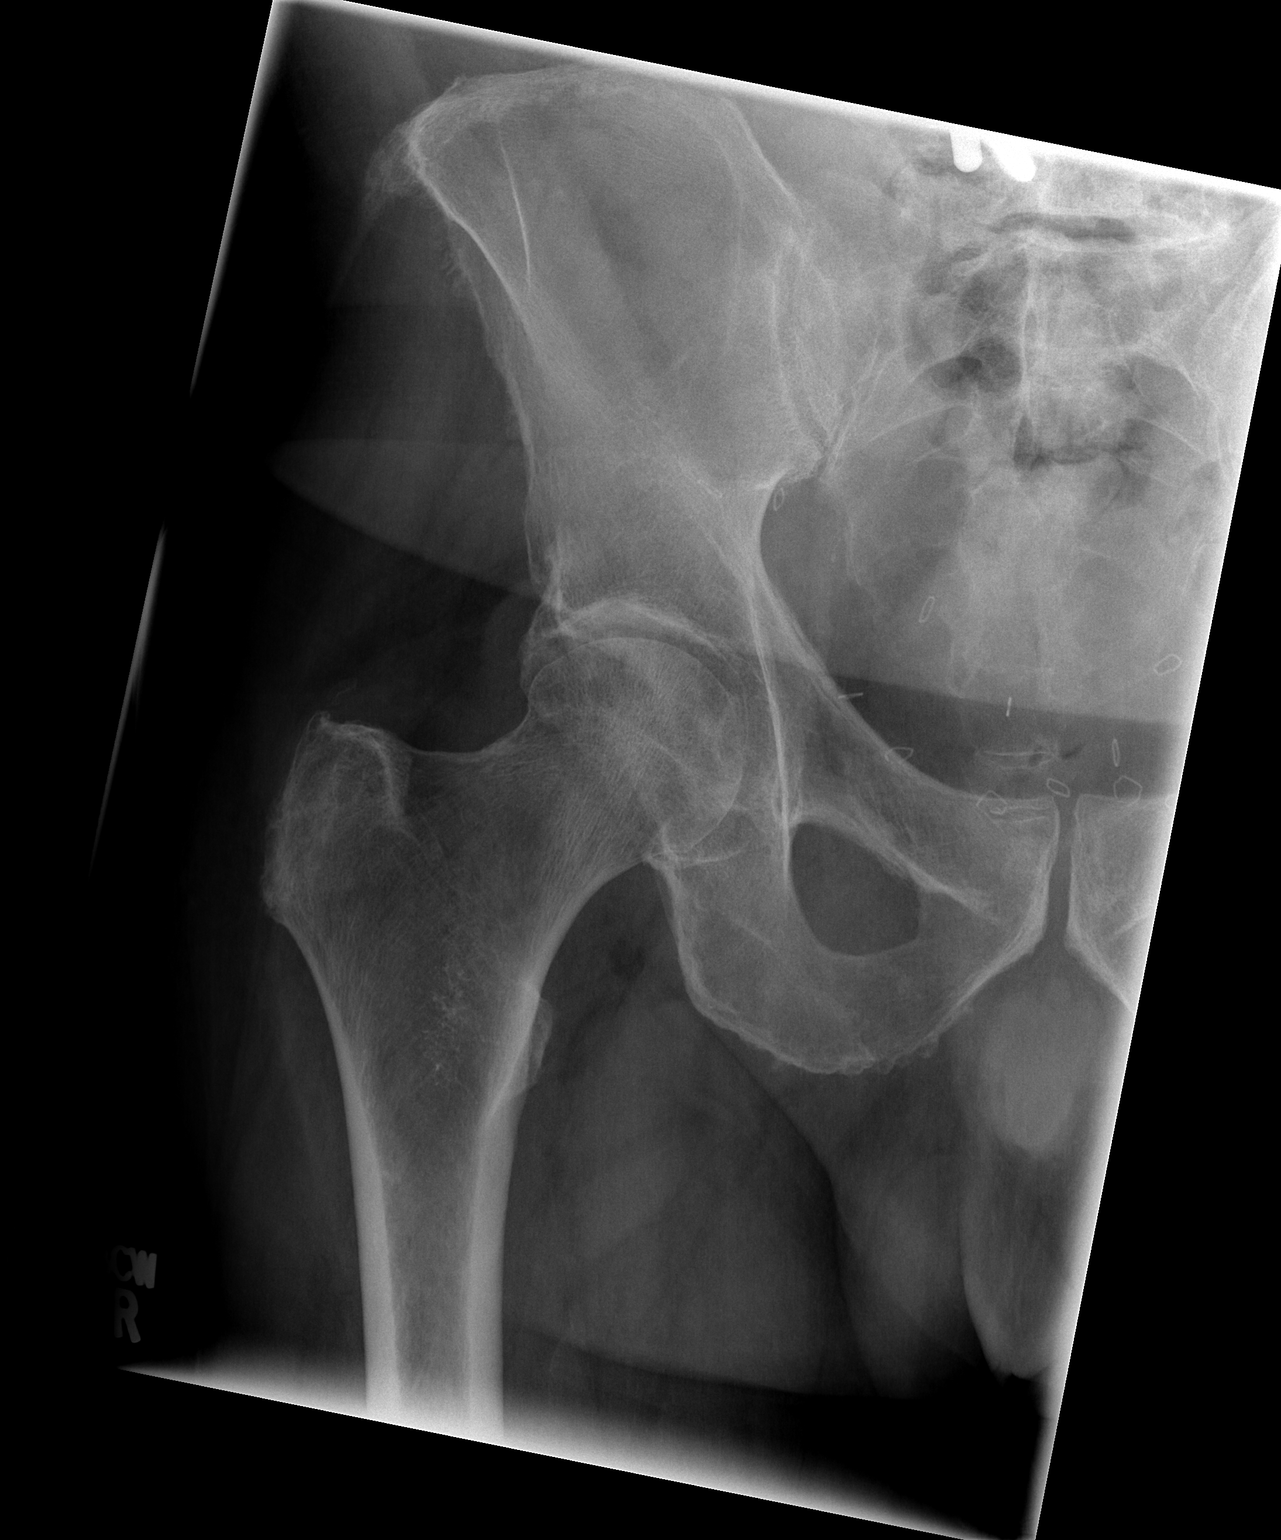

[t hip frog leg right]
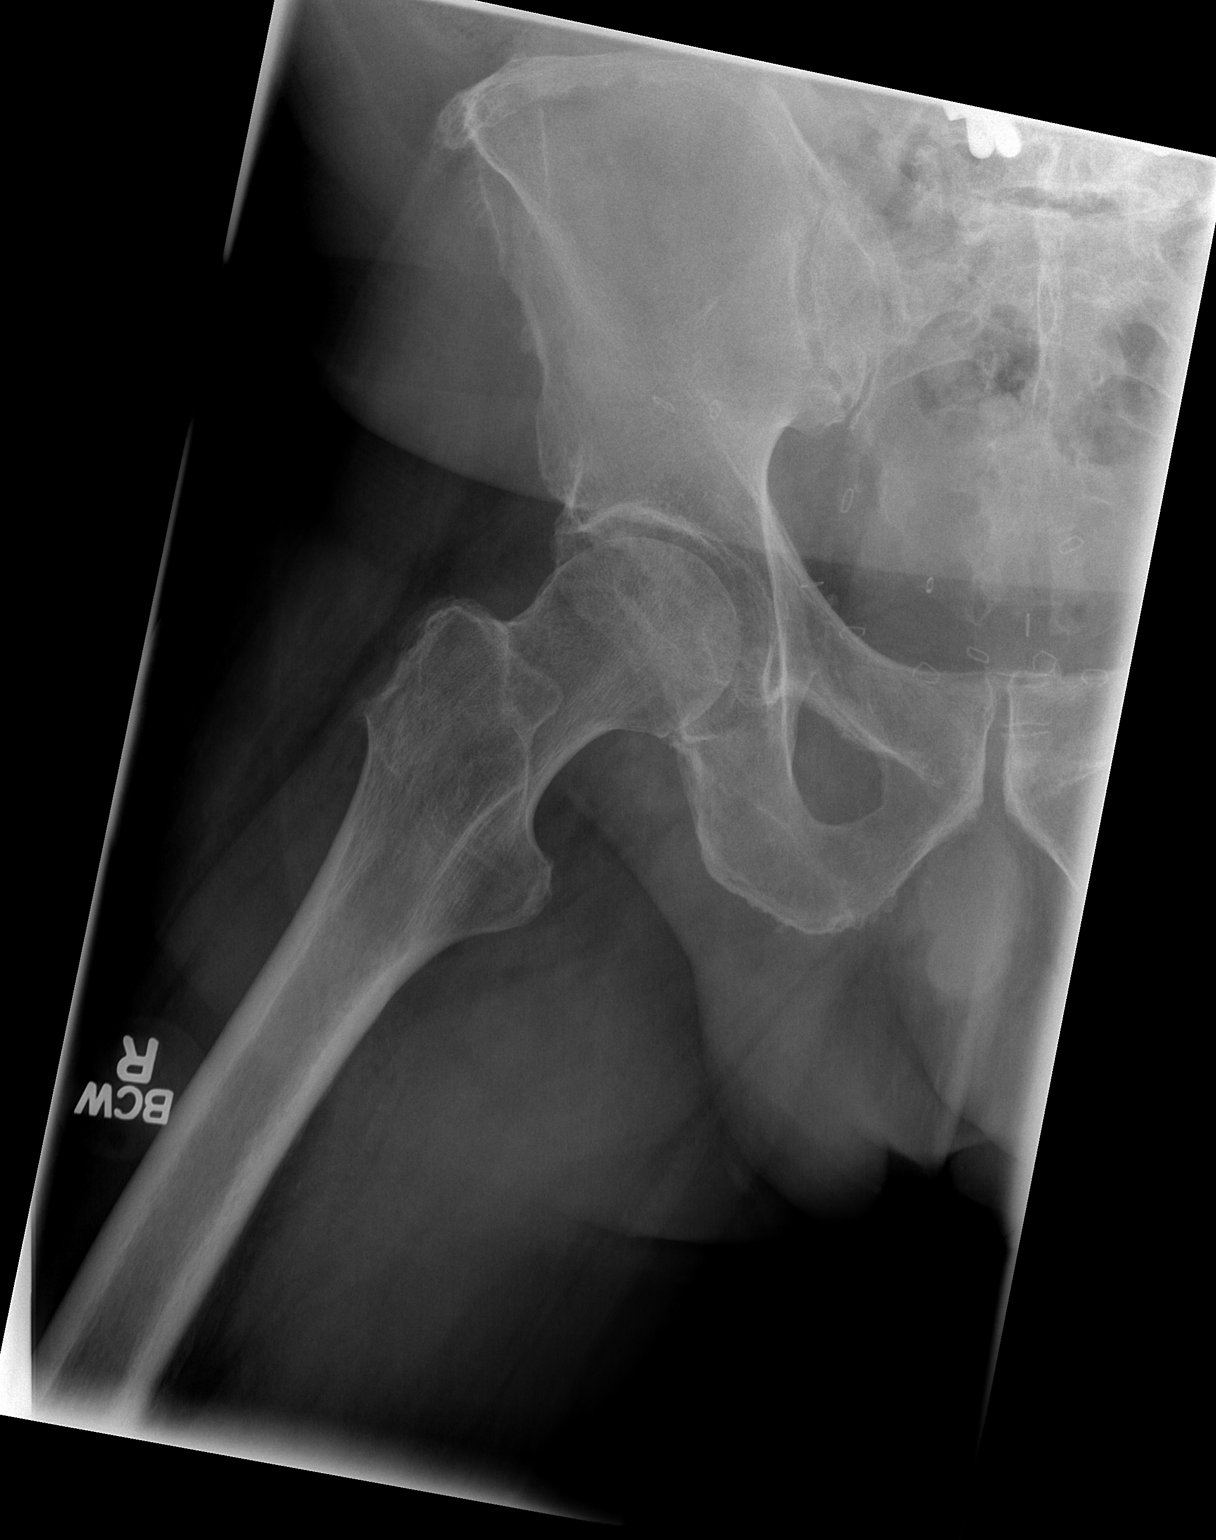

[3 of 3 positions shown; findings below may reference images not displayed]

FINDINGS: AP view of the pelvis demonstrates no acute displaced fracture of
the bony pelvic ring. Bilateral proximal femurs as visualized are
intact. Right femoral head is located. Mild joint space narrowing,
subchondral sclerosis, subchondral cyst formation and osteophyte
formation in the hip joints bilaterally, indicative of
osteoarthritis. Orthopedic fixation hardware in the lower lumbar
spine incompletely visualized. Numerous surgical clips project over
the low anatomic pelvis.
IMPRESSION: 1. No acute radiographic abnormality of the bony pelvis or the right
hip.
2. Mild bilateral hip joint osteoarthritis.

## 2016-02-01 DIAGNOSIS — F3342 Major depressive disorder, recurrent, in full remission: Secondary | ICD-10-CM | POA: Insufficient documentation

## 2016-02-01 DIAGNOSIS — E7849 Other hyperlipidemia: Secondary | ICD-10-CM | POA: Insufficient documentation

## 2016-02-01 DIAGNOSIS — J309 Allergic rhinitis, unspecified: Secondary | ICD-10-CM | POA: Insufficient documentation

## 2016-02-01 DIAGNOSIS — M545 Other chronic pain: Secondary | ICD-10-CM | POA: Insufficient documentation

## 2016-02-01 DIAGNOSIS — I5032 Chronic diastolic (congestive) heart failure: Secondary | ICD-10-CM | POA: Insufficient documentation

## 2016-02-01 DIAGNOSIS — G8929 Other chronic pain: Secondary | ICD-10-CM | POA: Insufficient documentation

## 2016-05-06 DIAGNOSIS — G2581 Restless legs syndrome: Secondary | ICD-10-CM | POA: Insufficient documentation

## 2019-08-07 DIAGNOSIS — H903 Sensorineural hearing loss, bilateral: Secondary | ICD-10-CM | POA: Insufficient documentation

## 2020-08-10 DIAGNOSIS — N481 Balanitis: Secondary | ICD-10-CM | POA: Insufficient documentation

## 2021-03-31 ENCOUNTER — Ambulatory Visit (INDEPENDENT_AMBULATORY_CARE_PROVIDER_SITE_OTHER): Payer: Medicare Other | Admitting: Podiatry

## 2021-03-31 ENCOUNTER — Other Ambulatory Visit: Payer: Self-pay

## 2021-03-31 ENCOUNTER — Encounter: Payer: Self-pay | Admitting: Podiatry

## 2021-03-31 DIAGNOSIS — Q828 Other specified congenital malformations of skin: Secondary | ICD-10-CM

## 2021-03-31 DIAGNOSIS — M79675 Pain in left toe(s): Secondary | ICD-10-CM

## 2021-03-31 DIAGNOSIS — G8929 Other chronic pain: Secondary | ICD-10-CM | POA: Diagnosis not present

## 2021-03-31 NOTE — Progress Notes (Signed)
This patient presents the office with chief complaint of a previously painful skin lesion on the outside of his right foot.  He says that the skin lesion fell off this morning at home and now it is greatly improved and there is no evidence of any pain.  Patient states he is having occasional pain in the fifth toe of the left foot.  He describes the pain as sharp shooting but is occasional in nature.  This patient is a diabetic and he presents for diabetic foot exam.    Vascular  Dorsalis pedis and posterior tibial pulses are palpable right foot.  Absent dorsalis pedis and posterior tibial pulses left foot. .  Capillary return  WNL.  Temperature gradient is  WNL.  Skin turgor  WNL  Sensorium  Senn Weinstein monofilament wire  WNL. Normal tactile sensation.  Nail Exam  Patient has normal nails with no evidence of bacterial or fungal infection.  Orthopedic  Exam  Muscle tone and muscle strength  WNL.  No limitations of motion feet  B/L.  No crepitus or joint effusion noted.  Foot type is unremarkable   Bony prominences are unremarkable.Patient has thickened fifth toe left foot in absence of swelling or redness.  HAV 1st MPJ right foot.  Skin  No open lesions.  Normal skin texture and turgor.  Healing skin lesion right heel.   Possible arthritic changes fifth toe left foot.  IE.  Discussed healing skin lesion with patient.  Told him that the skin lesion may regrow.  Told him about his fifth toe left foot.  Patient is not interested in x-rays since there is no history of trauma. RTC prn   Helane Gunther DPM
